# Patient Record
Sex: Male | Born: 1955 | Hispanic: Yes | Marital: Married | Smoking: Never smoker
Health system: Southern US, Community
[De-identification: ages and names within clinical notes are randomized; demographics above are authoritative.]

## PROBLEM LIST (undated history)

## (undated) DIAGNOSIS — J45909 Unspecified asthma, uncomplicated: Secondary | ICD-10-CM

---

## 2018-09-29 ENCOUNTER — Inpatient Hospital Stay (HOSPITAL_COMMUNITY)
Admission: EM | Admit: 2018-09-29 | Discharge: 2018-10-05 | DRG: 177 | Disposition: A | Discharge status: Home / Self Care | Payer: Medicare Other | Attending: Internal Medicine | Admitting: Internal Medicine

## 2018-09-29 ENCOUNTER — Encounter (HOSPITAL_COMMUNITY): Payer: Self-pay | Admitting: *Deleted

## 2018-09-29 ENCOUNTER — Emergency Department (HOSPITAL_COMMUNITY): Payer: Medicare Other

## 2018-09-29 ENCOUNTER — Other Ambulatory Visit: Payer: Self-pay

## 2018-09-29 DIAGNOSIS — U071 COVID-19: Secondary | ICD-10-CM | POA: Diagnosis not present

## 2018-09-29 DIAGNOSIS — R0602 Shortness of breath: Secondary | ICD-10-CM

## 2018-09-29 DIAGNOSIS — J069 Acute upper respiratory infection, unspecified: Secondary | ICD-10-CM

## 2018-09-29 DIAGNOSIS — R740 Nonspecific elevation of levels of transaminase and lactic acid dehydrogenase [LDH]: Secondary | ICD-10-CM | POA: Diagnosis present

## 2018-09-29 DIAGNOSIS — J1282 Pneumonia due to coronavirus disease 2019: Secondary | ICD-10-CM | POA: Diagnosis present

## 2018-09-29 DIAGNOSIS — J9601 Acute respiratory failure with hypoxia: Secondary | ICD-10-CM | POA: Diagnosis present

## 2018-09-29 DIAGNOSIS — J45909 Unspecified asthma, uncomplicated: Secondary | ICD-10-CM | POA: Diagnosis present

## 2018-09-29 DIAGNOSIS — R079 Chest pain, unspecified: Secondary | ICD-10-CM | POA: Diagnosis not present

## 2018-09-29 DIAGNOSIS — J1289 Other viral pneumonia: Secondary | ICD-10-CM | POA: Diagnosis present

## 2018-09-29 DIAGNOSIS — J129 Viral pneumonia, unspecified: Secondary | ICD-10-CM

## 2018-09-29 HISTORY — DX: Unspecified asthma, uncomplicated: J45.909

## 2018-09-29 LAB — CBC
HCT: 42.1 % (ref 39.0–52.0)
Hemoglobin: 13.9 g/dL (ref 13.0–17.0)
MCH: 31.1 pg (ref 26.0–34.0)
MCHC: 33 g/dL (ref 30.0–36.0)
MCV: 94.2 fL (ref 80.0–100.0)
Platelets: 211 10*3/uL (ref 150–400)
RBC: 4.47 MIL/uL (ref 4.22–5.81)
RDW: 13.8 % (ref 11.5–15.5)
WBC: 6.1 10*3/uL (ref 4.0–10.5)
nRBC: 0 % (ref 0.0–0.2)

## 2018-09-29 LAB — COMPREHENSIVE METABOLIC PANEL
ALT: 182 U/L — ABNORMAL HIGH (ref 0–44)
AST: 121 U/L — ABNORMAL HIGH (ref 15–41)
Albumin: 2.6 g/dL — ABNORMAL LOW (ref 3.5–5.0)
Alkaline Phosphatase: 105 U/L (ref 38–126)
Anion gap: 10 (ref 5–15)
BUN: 16 mg/dL (ref 8–23)
CO2: 23 mmol/L (ref 22–32)
Calcium: 8.6 mg/dL — ABNORMAL LOW (ref 8.9–10.3)
Chloride: 104 mmol/L (ref 98–111)
Creatinine, Ser: 0.84 mg/dL (ref 0.61–1.24)
GFR calc Af Amer: >60 mL/min (ref >60)
GFR calc non Af Amer: >60 mL/min (ref >60)
Glucose, Bld: 121 mg/dL — ABNORMAL HIGH (ref 70–99)
Potassium: 4 mmol/L (ref 3.5–5.1)
Sodium: 137 mmol/L (ref 135–145)
Total Bilirubin: 0.4 mg/dL (ref 0.3–1.2)
Total Protein: 6.2 g/dL — ABNORMAL LOW (ref 6.5–8.1)

## 2018-09-29 LAB — SARS CORONAVIRUS 2: SARS Coronavirus 2: DETECTED — AB

## 2018-09-29 LAB — LACTIC ACID, PLASMA: Lactic Acid, Venous: 1.5 mmol/L (ref 0.5–1.9)

## 2018-09-29 LAB — TROPONIN I: Troponin I: <0.03 ng/mL (ref <0.03)

## 2018-09-29 MED ORDER — SODIUM CHLORIDE 0.9% FLUSH: 3.0000 mL | Freq: Once | INTRAVENOUS | Status: DC

## 2018-09-29 MED ORDER — SODIUM CHLORIDE 0.9 % IV BOLUS
1000.0000 mL | Freq: Once | INTRAVENOUS | Status: AC
  Administered 2018-09-29: 1000 mL via INTRAVENOUS

## 2018-09-29 NOTE — ED Triage Notes (Signed)
Via interpreter, pt says he has been feeling bad since Tuesday (fevers and cough). Reports had covid test done at health department, no results yet. Says he was at the hospital last night and not feeling any better. C/o tightness in his chest and SOB.    Per patient daughter via phone the pt was in the ED last night in [place] and was dx with PNA, not given any meds for the same. She has been checking his oxygen and temperature during the day and his temp has been high, oxygen has been as low as 86%. Seems like his breathing is worse. Reports pt son received +covid test on Thursday.

## 2018-09-29 NOTE — ED Notes (Signed)
Call pt daughter with update please Richardson Chiquito [phone]

## 2018-09-29 NOTE — ED Provider Notes (Signed)
[facility] EMERGENCY DEPARTMENT Provider Note   CSN: [id number] Arrival date & time: 09/29/18  2022     History   Chief Complaint Chief Complaint  Patient presents with  . Chest Pain    HPI Charles Arias is a 63 y.o. male who presents with 4 days of fever, chills, body aches, worsening dyspnea.  He is associated dysgeusia and anosmia.  Patient son recently tested positive for corona virus.  He had an outpatient test which is still pending however was unable to wait longer because of his worsening shortness of breath.  Denies abdominal pain, nausea, vomiting, urinary symptoms, diarrhea.     HPI  Past Medical History:  Diagnosis Date  . Asthma     There are no active problems to display for this patient.   History reviewed. No pertinent surgical history.      Home Medications    Prior to Admission medications   Not on File    Family History No family history on file.  Social History Social History   Tobacco Use  . Smoking status: Never Smoker  Substance Use Topics  . Alcohol use: Yes  . Drug use: Never     Allergies   Patient has no known allergies.   Review of Systems Review of Systems Ten systems reviewed and are negative for acute change, except as noted in the HPI.    Physical Exam Updated Vital Signs BP 139/86 (BP Location: Right Arm)   Pulse (!) 106   Temp 99.9 F (37.7 C) (Oral)   Resp (!) 22   SpO2 93%   Physical Exam Vitals signs and nursing note reviewed.  Constitutional:      General: He is not in acute distress.    Appearance: He is well-developed. He is ill-appearing. He is not diaphoretic.  HENT:     Head: Normocephalic and atraumatic.  Eyes:     General: No scleral icterus.    Extraocular Movements: Extraocular movements intact.     Conjunctiva/sclera: Conjunctivae normal.     Pupils: Pupils are equal, round, and reactive to light.  Neck:     Musculoskeletal: Normal range of motion and neck supple.   Cardiovascular:     Rate and Rhythm: Normal rate and regular rhythm.     Heart sounds: Normal heart sounds.  Pulmonary:     Effort: Pulmonary effort is normal. Tachypnea present. No respiratory distress.     Breath sounds: Normal breath sounds. No wheezing.  Abdominal:     Palpations: Abdomen is soft.     Tenderness: There is no abdominal tenderness.  Skin:    General: Skin is warm and dry.  Neurological:     General: No focal deficit present.     Mental Status: He is alert and oriented to person, place, and time.  Psychiatric:        Behavior: Behavior normal.      ED Treatments / Results  Labs (all labs ordered are listed, but only abnormal results are displayed) Labs Reviewed  SARS CORONAVIRUS 2 (HOSPITAL ORDER, [place] [facility])  CULTURE, BLOOD (ROUTINE X 2)  CULTURE, BLOOD (ROUTINE X 2)  CBC  TROPONIN I  LACTIC ACID, PLASMA  LACTIC ACID, PLASMA  COMPREHENSIVE METABOLIC PANEL  URINALYSIS, ROUTINE W REFLEX MICROSCOPIC    EKG    Radiology Dg Chest Portable 1 View  Result Date: 09/29/2018 CLINICAL DATA:  Shortness of breath, chest pain EXAM: PORTABLE CHEST 1 VIEW COMPARISON:  None.  FINDINGS: Heart is upper limits normal in size. Suspect patchy bilateral airspace opacities. No effusions. No acute bony abnormality. IMPRESSION: Patchy bilateral airspace opacities concerning for pneumonia. Electronically Signed   By: Charlett NoseKevin  Dover M.D.   On: 09/29/2018 21:35    Procedures Procedures (including critical care time)  Medications Ordered in ED Medications  sodium chloride flush (NS) 0.9 % injection 3 mL (has no administration in time range)  sodium chloride 0.9 % bolus 1,000 mL (has no administration in time range)     Initial Impression / Assessment and Plan / ED Course  I have reviewed the triage vital signs and the nursing notes.  Pertinent labs & imaging results that were available during my care of the patient were reviewed by me and  considered in my medical decision making (see chart for details).  Clinical Course as of Sep 29 2335  [name] Sep 29, 2018  2232 AST(!): 121 [AH]  2233 ALT(!): 182 [AH]  2333 SARS Coronavirus 2(!): DETECTED [AH]    Clinical Course User Index [AH] Arthor CaptainHarris, Neha Waight, PA-C       11:38 PM BP 139/86 (BP Location: Right Arm)   Pulse (!) 106   Temp 99.9 F (37.7 C) (Oral)   Resp (!) 22   Ht 5' 2.99" (1.6 m)   Wt 70.3 kg   SpO2 93%   BMI 27.46 kg/m    CC:.  Viral-like symptoms with cough, body ache, chills and shortness of breath, exposure to known coronavirus VS: BP 139/86 (BP Location: Right Arm)   Pulse (!) 106   Temp 99.9 F (37.7 C) (Oral)   Resp (!) 22   Ht 5' 2.99" (1.6 m)   Wt 70.3 kg   SpO2 93%   BMI 27.46 kg/m   Persistent tachypnea, dyspnea without overt respiratory distress ZO:XWRUEAVHX:History is gathered by patient and EMR. DDX:The emergent differential diagnosis for shortness of breath includes, but is not limited to, Pulmonary edema, bronchoconstriction, Pneumonia, Pulmonary embolism, Pneumotherax/ Hemothorax, Dysrythmia, ACS.  Labs: I reviewed the labs which show slightly elevated glucose, mildly low albumin, elevated AST and ALT.  No previous labs to compare to.  Urine is pending, negative troponin, negative lactic acid, no leukocytosis Imaging: I personally reviewed the images (portable 1 view chest x-ray) which show(s) multifocal/multi lobar groundglass opacity and patchy infiltrates. EKG:  EKG Interpretation  Date/Time:  Sunday September 29 2018 20:35:39 EDT Ventricular Rate:  127 PR Interval:  140 QRS Duration: 72 QT Interval:  300 QTC Calculation: 436 R Axis:   18 Text Interpretation:  Sinus tachycardia Otherwise normal ECG agree. no old comparison Confirmed by Arby BarrettePfeiffer, Marcy [phone]([id number]) on 09/29/2018 11:41:18 PM       MDM: With coronavirus positive, multifocal pneumonia, oxygen saturations are low.  He has worsening dyspnea for the past several days which brought him  to the emergency department.  Feel the patient will need admission.  His persistent tachypnea throughout his visit Patient disposition: Admit Patient condition: Stable. The patient appears reasonably stabilized for admission considering the current resources, flow, and capabilities available in the ED at this time, and I doubt any other [facility] requiring further screening and/or treatment in the ED prior to admission.   Charles Arias Uribe was evaluated in Emergency Department on 09/29/2018 for the symptoms described in the history of present illness. He was evaluated in the context of the global COVID-19 pandemic, which necessitated consideration that the patient might be at risk for infection with the SARS-CoV-2 virus that causes COVID-19. Institutional protocols  and algorithms that pertain to the evaluation of patients at risk for COVID-19 are in a state of rapid change based on information released by regulatory bodies including the CDC and federal and state organizations. These policies and algorithms were followed during the patient's care in the ED.  Final Clinical Impressions(s) / ED Diagnoses   Final diagnoses:  COVID-19 virus infection  Viral pneumonia, unspecified  Shortness of breath    ED Discharge Orders    None       Arthor CaptainHarris, Dreanna Kyllo, PA-C 09/29/18 2352    Arby BarrettePfeiffer, Marcy, MD 09/30/18 2040

## 2018-09-30 ENCOUNTER — Encounter (HOSPITAL_COMMUNITY): Payer: Self-pay

## 2018-09-30 DIAGNOSIS — J069 Acute upper respiratory infection, unspecified: Secondary | ICD-10-CM

## 2018-09-30 DIAGNOSIS — R0602 Shortness of breath: Secondary | ICD-10-CM | POA: Diagnosis not present

## 2018-09-30 DIAGNOSIS — J9601 Acute respiratory failure with hypoxia: Secondary | ICD-10-CM | POA: Diagnosis present

## 2018-09-30 DIAGNOSIS — R079 Chest pain, unspecified: Secondary | ICD-10-CM | POA: Diagnosis present

## 2018-09-30 DIAGNOSIS — J1289 Other viral pneumonia: Secondary | ICD-10-CM | POA: Diagnosis present

## 2018-09-30 DIAGNOSIS — J1282 Pneumonia due to coronavirus disease 2019: Secondary | ICD-10-CM | POA: Diagnosis present

## 2018-09-30 DIAGNOSIS — J129 Viral pneumonia, unspecified: Secondary | ICD-10-CM | POA: Diagnosis not present

## 2018-09-30 DIAGNOSIS — U071 COVID-19: Secondary | ICD-10-CM | POA: Diagnosis present

## 2018-09-30 DIAGNOSIS — R740 Nonspecific elevation of levels of transaminase and lactic acid dehydrogenase [LDH]: Secondary | ICD-10-CM | POA: Diagnosis present

## 2018-09-30 DIAGNOSIS — J45909 Unspecified asthma, uncomplicated: Secondary | ICD-10-CM | POA: Diagnosis present

## 2018-09-30 LAB — URINALYSIS, ROUTINE W REFLEX MICROSCOPIC
Bilirubin Urine: NEGATIVE
Glucose, UA: NEGATIVE mg/dL
Hgb urine dipstick: NEGATIVE
Ketones, ur: NEGATIVE mg/dL
Leukocytes,Ua: NEGATIVE
Nitrite: NEGATIVE
Protein, ur: 30 mg/dL — AB
Specific Gravity, Urine: 1.016 (ref 1.005–1.030)
pH: 6 (ref 5.0–8.0)

## 2018-09-30 LAB — FERRITIN: Ferritin: 1062 ng/mL — ABNORMAL HIGH (ref 24–336)

## 2018-09-30 LAB — LACTATE DEHYDROGENASE: LDH: 525 U/L — ABNORMAL HIGH (ref 98–192)

## 2018-09-30 LAB — PROCALCITONIN: Procalcitonin: 0.36 ng/mL

## 2018-09-30 LAB — LACTIC ACID, PLASMA: Lactic Acid, Venous: 1.4 mmol/L (ref 0.5–1.9)

## 2018-09-30 LAB — HIV ANTIBODY (ROUTINE TESTING W REFLEX): HIV Screen 4th Generation wRfx: NONREACTIVE

## 2018-09-30 LAB — C-REACTIVE PROTEIN: CRP: 16.1 mg/dL — ABNORMAL HIGH (ref <1.0)

## 2018-09-30 LAB — D-DIMER, QUANTITATIVE: D-Dimer, Quant: 1.56 ug/mL-FEU — ABNORMAL HIGH (ref 0.00–0.50)

## 2018-09-30 LAB — ABO/RH: ABO/RH(D): A POS

## 2018-09-30 MED ORDER — ALBUTEROL SULFATE HFA 108 (90 BASE) MCG/ACT IN AERS
2.0000 | INHALATION_SPRAY | RESPIRATORY_TRACT | Status: DC | PRN
  Filled 2018-09-30: qty 6.7

## 2018-09-30 MED ORDER — TOCILIZUMAB 400 MG/20ML IV SOLN
8.0000 mg/kg | Freq: Once | INTRAVENOUS | Status: AC
  Administered 2018-09-30: 12:00:00 562 mg via INTRAVENOUS
  Filled 2018-09-30: qty 20

## 2018-09-30 MED ORDER — SODIUM CHLORIDE 0.9 % IV SOLN
200.0000 mg | Freq: Once | INTRAVENOUS | Status: AC
  Administered 2018-09-30: 200 mg via INTRAVENOUS
  Filled 2018-09-30: qty 40

## 2018-09-30 MED ORDER — ONDANSETRON HCL 4 MG PO TABS: 4.0000 mg | ORAL_TABLET | Freq: Four times a day (QID) | ORAL | Status: DC | PRN

## 2018-09-30 MED ORDER — ACETAMINOPHEN 325 MG PO TABS
650.0000 mg | ORAL_TABLET | Freq: Four times a day (QID) | ORAL | Status: DC | PRN
  Administered 2018-10-02: 650 mg via ORAL
  Filled 2018-09-30: qty 2

## 2018-09-30 MED ORDER — SODIUM CHLORIDE 0.9 % IV SOLN: 100.0000 mg | INTRAVENOUS | Status: DC

## 2018-09-30 MED ORDER — METHYLPREDNISOLONE SODIUM SUCC 40 MG IJ SOLR
40.0000 mg | Freq: Two times a day (BID) | INTRAMUSCULAR | Status: DC
  Administered 2018-09-30 – 2018-10-04 (×10): 40 mg via INTRAVENOUS
  Filled 2018-09-30 (×10): qty 1

## 2018-09-30 MED ORDER — ONDANSETRON HCL 4 MG/2ML IJ SOLN: 4.0000 mg | Freq: Four times a day (QID) | INTRAMUSCULAR | Status: DC | PRN

## 2018-09-30 MED ORDER — METHYLPREDNISOLONE SODIUM SUCC 125 MG IJ SOLR
80.0000 mg | Freq: Once | INTRAMUSCULAR | Status: AC
  Administered 2018-09-30: 80 mg via INTRAVENOUS
  Filled 2018-09-30: qty 2

## 2018-09-30 MED ORDER — SODIUM CHLORIDE 0.9 % IV SOLN
100.0000 mg | INTRAVENOUS | Status: AC
  Administered 2018-09-30 – 2018-10-03 (×4): 100 mg via INTRAVENOUS
  Filled 2018-09-30 (×4): qty 20

## 2018-09-30 MED ORDER — ENOXAPARIN SODIUM 40 MG/0.4ML ~~LOC~~ SOLN
40.0000 mg | SUBCUTANEOUS | Status: DC
  Administered 2018-09-30 – 2018-10-04 (×5): 40 mg via SUBCUTANEOUS
  Filled 2018-09-30 (×6): qty 0.4

## 2018-09-30 NOTE — Progress Notes (Signed)
Patient is admitted and oriented to room 157. Utilized Engineer, petroleum. Admission assessment complete. Questions encouraged and answered.  [place], pt's daughter, to notify her of his admission and to discuss the plan of care. Questions were encouraged and answered.  Paged Dr. Sarajane Jews to notify of patient's arrival.

## 2018-09-30 NOTE — Progress Notes (Signed)
PROGRESS NOTE  Charles Arias ZOX:[id number] DOB: [birth year] DOA: 09/29/2018 PCP: Patient, No Pcp Per   LOS: 0 days   Brief Narrative / Interim history: 63 year old male with history of asthma but otherwise fairly healthy on no medications at home comes to the hospital with 4 days history of fever chills, body aches, worsening shortness of breath as well as loss of smell and taste  Subjective: Complains of shortness of breath but feels slightly improved.  No chest pain, no abdominal pain, no nausea or vomiting.  His breathing gets worse especially if he tries to take a few steps.  No fevers this morning  Assessment & Plan: Principal Problem:   Acute respiratory disease due to COVID-19 virus Active Problems:   Acute respiratory failure with hypoxia (HCC)   Principal Problem Acute Hypoxic Respiratory Failure due to Covid-19 Viral Illness -Patient admitted to hospital with hypoxic respiratory failure, fever, and COVID-19 viral pneumonia -Patient was started on Remdesivir on 6/15 -Start steroids on 6/15 -Given significantly elevated inflammatory markers along with tachypnea and chest x-ray findings, discussed with patient, agrees to Actemra  The treatment plan and use of medications and known side effects were discussed with patient/family, they were clearly explained that there is no proven definitive treatment for COVID-19 infection, any medications used here are based on published clinical articles/anecdotal data which are not peer-reviewed or randomized control trials.  Complete risks and long-term side effects are unknown, however in the best clinical judgment they seem to be of some clinical benefit rather than medical risks.  Patient agree with the treatment plan and want to receive the given medications.  COVID-19 Labs  Recent Labs    09/30/18 0207  DDIMER 1.56*  FERRITIN 1,062*  LDH 525*  CRP 16.1*    Lab Results  Component Value Date   SARSCOV2NAA DETECTED (A) 09/29/2018    Active Problems History of asthma -Scattered end expiratory wheezing present, continue albuterol inhaler as needed as well as steroids  Elevated LFTs -Likely in the setting of viral illness, less than 5 times upper limit of normal -monitor  Scheduled Meds: . enoxaparin (LOVENOX) injection  40 mg Subcutaneous Q24H  . methylPREDNISolone (SOLU-MEDROL) injection  40 mg Intravenous Q12H  . sodium chloride flush  3 mL Intravenous Once   Continuous Infusions: . [START ON 10/01/2018] remdesivir 100 mg in NS 250 mL    . tocilizumab (ACTEMRA) IV     PRN Meds:.acetaminophen, albuterol, ondansetron **OR** ondansetron (ZOFRAN) IV  DVT prophylaxis: Lovenox Code Status: Full code Family Communication: No family at bedside, discussed with patient via interpreter Disposition Plan: Home when ready  Consultants:   None  Procedures:   None   Antimicrobials:  Remdesivir   Objective: Vitals:   09/29/18 2247 09/30/18 0552 09/30/18 0600 09/30/18 0948  BP:  128/82  126/82  Pulse:   85 91  Resp:   (!) 23 (!) 24  Temp:  97.9 F (36.6 C)  97.9 F (36.6 C)  TempSrc:  Oral  Axillary  SpO2:   94% 94%  Weight: 70.3 kg     Height: 5' 2.99" (1.6 m)       Intake/Output Summary (Last 24 hours) at 09/30/2018 1031 Last data filed at 09/30/2018 0900 Gross per 24 hour  Intake 1240 ml  Output 975 ml  Net 265 ml   Filed Weights   09/29/18 2247  Weight: 70.3 kg    Examination:  Constitutional: Appears comfortable but visibly tachypneic Eyes: PERRL, lids and conjunctivae normal ENMT: Mucous  membranes are moist. No oropharyngeal exudates Neck: normal, supple Respiratory: Faint bibasilar rhonchi, diminished end expiratory wheezing, no crackles, increased respiratory effort Cardiovascular: Regular rate and rhythm, no murmurs / rubs / gallops. No LE edema.  Abdomen: no tenderness. Bowel sounds positive.  Musculoskeletal: no clubbing / cyanosis.  Skin: no rashes Neurologic: CN 2-12 grossly  intact. Strength 5/5 in all 4.  Psychiatric: Normal judgment and insight. Alert and oriented x 3. Normal mood.    Data Reviewed: I have independently reviewed following labs and imaging studies   bilateral infiltrates    CBC: Recent Labs  Lab 09/29/18 2116  WBC 6.1  HGB 13.9  HCT 42.1  MCV 94.2  PLT 914   Basic Metabolic Panel: Recent Labs  Lab 09/29/18 2116  NA 137  K 4.0  CL 104  CO2 23  GLUCOSE 121*  BUN 16  CREATININE 0.84  CALCIUM 8.6*   GFR: Estimated Creatinine Clearance: 77.3 mL/min (by C-G formula based on SCr of 0.84 mg/dL). Liver Function Tests: Recent Labs  Lab 09/29/18 2116  AST 121*  ALT 182*  ALKPHOS 105  BILITOT 0.4  PROT 6.2*  ALBUMIN 2.6*   No results for input(s): LIPASE, AMYLASE in the last 168 hours. No results for input(s): AMMONIA in the last 168 hours. Coagulation Profile: No results for input(s): INR, PROTIME in the last 168 hours. Cardiac Enzymes: Recent Labs  Lab 09/29/18 2116  TROPONINI <0.03   BNP (last 3 results) No results for input(s): PROBNP in the last 8760 hours. HbA1C: No results for input(s): HGBA1C in the last 72 hours. CBG: No results for input(s): GLUCAP in the last 168 hours. Lipid Profile: No results for input(s): CHOL, HDL, LDLCALC, TRIG, CHOLHDL, LDLDIRECT in the last 72 hours. Thyroid Function Tests: No results for input(s): TSH, T4TOTAL, FREET4, T3FREE, THYROIDAB in the last 72 hours. Anemia Panel: Recent Labs    09/30/18 0207  FERRITIN 1,062*   Urine analysis:    Component Value Date/Time   COLORURINE YELLOW 09/30/2018 0239   APPEARANCEUR CLEAR 09/30/2018 0239   LABSPEC 1.016 09/30/2018 0239   PHURINE 6.0 09/30/2018 0239   GLUCOSEU NEGATIVE 09/30/2018 0239   HGBUR NEGATIVE 09/30/2018 0239   BILIRUBINUR NEGATIVE 09/30/2018 0239   KETONESUR NEGATIVE 09/30/2018 0239   PROTEINUR 30 (A) 09/30/2018 0239   NITRITE NEGATIVE 09/30/2018 0239   LEUKOCYTESUR NEGATIVE 09/30/2018 0239   Sepsis  Labs: Invalid input(s): PROCALCITONIN, LACTICIDVEN  Recent Results (from the past 240 hour(s))  SARS Coronavirus 2     Status: Abnormal   Collection Time: 09/29/18  9:40 PM  Result Value Ref Range Status   SARS Coronavirus 2 DETECTED (A) NOT DETECTED Final    Comment: RESULT CALLED TO, READ BACK BY AND VERIFIED WITH: RN J LEBRON @2325  09/29/18 BY S GEZAHEGN (NOTE) SARS-CoV-2 target nucleic acids are DETECTED. The SARS-CoV-2 RNA is generally detectable in upper and lower respiratory specimens during the acute phase of infection. Positive results are indicative of active infection with SARS-CoV-2. Clinical  correlation with patient history and other diagnostic information is necessary to determine patient infection status. Positive results do  not rule out bacterial infection or co-infection with other viruses. The expected result is Not Detected. Fact Sheet for Patients: http://www.biofiredefense.com/wp-content/uploads/2020/03/BIOFIRE-COVID -19-patients.pdf Fact Sheet for Healthcare Providers: http://www.biofiredefense.com/wp-content/uploads/2020/03/BIOFIRE-COVID -19-hcp.pdf This test is not yet approved or cleared by the [place] and  has been authorized for detection and/or diagnosis of SARS-CoV-2 by FDA under an Patent examiner y Insurance account manager (EUA).  This EUA will  remain in effect (meaning this test can be used) for the duration of  the COVID-19 declaration under Section 564(b)(1) of the Act, 21 U.S.C. section [id number] 3(b)(1), unless the authorization is terminated or revoked sooner. Performed at [facility], [address] [place], [place] [address]       Radiology Studies: Dg Chest Portable 1 View  Result Date: 09/29/2018 CLINICAL DATA:  Shortness of breath, chest pain EXAM: PORTABLE CHEST 1 VIEW COMPARISON:  None. FINDINGS: Heart is upper limits normal in size. Suspect patchy bilateral airspace opacities. No effusions. No acute bony abnormality. IMPRESSION:  Patchy bilateral airspace opacities concerning for pneumonia. Electronically Signed   By: Charlett NoseKevin  Dover M.D.   On: 09/29/2018 21:35     Pamella Pertostin Gherghe, MD, PhD [facility]  Contact via  [email or web address]  [facility] Office Info P: [phone]  F: [phone]

## 2018-09-30 NOTE — ED Notes (Signed)
Urine culture sent to lab with urine 

## 2018-09-30 NOTE — Progress Notes (Signed)
[facility] physician asked me to give single dose of solumedrol 80mg  and put in for remdesivir per pharm.

## 2018-09-30 NOTE — ED Notes (Signed)
ED TO INPATIENT HANDOFF REPORT  ED Nurse Name and Phone #: [phone]  S Name/Age/Gender Francie MassingJavier Uribe 63 y.o. male Room/Bed: 027C/027C  Code Status Full code   Home/SNF/Other HOme AO x 4 spanish speaker   Triage Complete: Triage complete  Chief Complaint PNEUMONIA, FEVER, SOB  Triage Note Via interpreter, pt says he has been feeling bad since Tuesday (fevers and cough). Reports had covid test done at health department, no results yet. Says he was at the hospital last night and not feeling any better. C/o tightness in his chest and SOB.    Per patient daughter via phone the pt was in the ED last night in [place] and was dx with PNA, not given any meds for the same. She has been checking his oxygen and temperature during the day and his temp has been high, oxygen has been as low as 86%. Seems like his breathing is worse. Reports pt son received +covid test on Thursday.    Allergies No Known Allergies  Level of Care/Admitting Diagnosis ED Disposition    ED Disposition Condition Comment   Admit  The patient appears reasonably stabilized for admission considering the current resources, flow, and capabilities available in the ED at this time, and I doubt any other [facility] requiring further screening and/or treatment in the ED prior to admission is  present.       B Medical/Surgery History Past Medical History:  Diagnosis Date  . Asthma    History reviewed. No pertinent surgical history.   A IV Location/Drains/Wounds Patient Lines/Drains/Airways Status   Active Line/Drains/Airways    Name:   Placement date:   Placement time:   Site:   Days:   Peripheral IV 09/29/18 Left Antecubital   09/29/18    2149    Antecubital   1          Intake/Output Last 24 hours No intake or output data in the 24 hours ending 09/30/18 0027  Labs/Imaging Results for orders placed or performed during the hospital encounter of 09/29/18 (from the past 48 hour(s))  CBC     Status: None   Collection Time: 09/29/18  9:16 PM  Result Value Ref Range   WBC 6.1 4.0 - 10.5 K/uL   RBC 4.47 4.22 - 5.81 MIL/uL   Hemoglobin 13.9 13.0 - 17.0 g/dL   HCT 45.442.1 09.839.0 - 11.952.0 %   MCV 94.2 80.0 - 100.0 fL   MCH 31.1 26.0 - 34.0 pg   MCHC 33.0 30.0 - 36.0 g/dL   RDW 14.713.8 82.911.5 - 56.215.5 %   Platelets 211 150 - 400 K/uL   nRBC 0.0 0.0 - 0.2 %    Comment: Performed at [facility], [address] [place], [place] [address]  Troponin I - ONCE - STAT     Status: None   Collection Time: 09/29/18  9:16 PM  Result Value Ref Range   Troponin I <0.03 <0.03 ng/mL    Comment: Performed at [facility], [address] [place], [place] [address]  Lactic acid, plasma     Status: None   Collection Time: 09/29/18  9:16 PM  Result Value Ref Range   Lactic Acid, Venous 1.5 0.5 - 1.9 mmol/L    Comment: Performed at [facility], [address] [place], [place] [address]  Comprehensive metabolic panel     Status: Abnormal   Collection Time: 09/29/18  9:16 PM  Result Value Ref Range   Sodium 137 135 -  145 mmol/L   Potassium 4.0 3.5 - 5.1 mmol/L   Chloride 104 98 - 111 mmol/L   CO2 23 22 - 32 mmol/L   Glucose, Bld 121 (H) 70 - 99 mg/dL   BUN 16 8 - 23 mg/dL   Creatinine, Ser 4.780.84 0.61 - 1.24 mg/dL   Calcium 8.6 (L) 8.9 - 10.3 mg/dL   Total Protein 6.2 (L) 6.5 - 8.1 g/dL   Albumin 2.6 (L) 3.5 - 5.0 g/dL   AST 295121 (H) 15 - 41 U/L   ALT 182 (H) 0 - 44 U/L   Alkaline Phosphatase 105 38 - 126 U/L   Total Bilirubin 0.4 0.3 - 1.2 mg/dL   GFR calc non Af Amer >60 >60 mL/min   GFR calc Af Amer >60 >60 mL/min   Anion gap 10 5 - 15    Comment: Performed at [facility], [address] [place], [place] [address]  SARS Coronavirus 2     Status: Abnormal   Collection Time: 09/29/18  9:40 PM  Result Value Ref Range   SARS Coronavirus 2 DETECTED (A) NOT DETECTED    Comment: RESULT CALLED TO, READ BACK BY AND VERIFIED WITH: RN J Mechele Kittleson @2325  09/29/18 BY S GEZAHEGN (NOTE) SARS-CoV-2  target nucleic acids are DETECTED. The SARS-CoV-2 RNA is generally detectable in upper and lower respiratory specimens during the acute phase of infection. Positive results are indicative of active infection with SARS-CoV-2. Clinical  correlation with patient history and other diagnostic information is necessary to determine patient infection status. Positive results do  not rule out bacterial infection or co-infection with other viruses. The expected result is Not Detected. Fact Sheet for Patients: http://www.biofiredefense.com/wp-content/uploads/2020/03/BIOFIRE-COVID -19-patients.pdf Fact Sheet for Healthcare Providers: http://www.biofiredefense.com/wp-content/uploads/2020/03/BIOFIRE-COVID -19-hcp.pdf This test is not yet approved or cleared by the [place] FDA and  has been authorized for detection and/or diagnosis of SARS-CoV-2 by FDA under an Lexicographermergenc y Investment banker, corporateUse Authorization (EUA).  This EUA will remain in effect (meaning this test can be used) for the duration of  the COVID-19 declaration under Section 564(b)(1) of the Act, 21 U.S.C. section [id number] 3(b)(1), unless the authorization is terminated or revoked sooner. Performed at [facility], [address] [place], [place] [address]    Dg Chest Portable 1 View  Result Date: 09/29/2018 CLINICAL DATA:  Shortness of breath, chest pain EXAM: PORTABLE CHEST 1 VIEW COMPARISON:  None. FINDINGS: Heart is upper limits normal in size. Suspect patchy bilateral airspace opacities. No effusions. No acute bony abnormality. IMPRESSION: Patchy bilateral airspace opacities concerning for pneumonia. Electronically Signed   By: Charlett NoseKevin  Dover M.D.   On: 09/29/2018 21:35    Pending Labs Unresulted Labs (From admission, onward)    Start     Ordered   09/29/18 2133  Blood Culture (routine x 2)  BLOOD CULTURE X 2,   STAT     09/29/18 2134   09/29/18 2133  Urinalysis, Routine w reflex microscopic  ONCE - STAT,   STAT     09/29/18 2134    09/29/18 2103  Lactic acid, plasma  Now then every 2 hours,   STAT     09/29/18 2103          Vitals/Pain Today's Vitals   09/29/18 2134 09/29/18 2200 09/29/18 2215 09/29/18 2247  BP: 139/86 125/85 (!) 143/83   Pulse: (!) 106 100 (!) 106   Resp: (!) 22 (!) 25 (!) 25   Temp: 99.9 F (37.7 C)  TempSrc: Oral     SpO2: 93% 92% 93%   Weight:    70.3 kg  Height:    5' 2.99" (1.6 m)  PainSc:        Isolation Precautions Droplet and Contact precautions  Medications Medications  sodium chloride flush (NS) 0.9 % injection 3 mL (3 mLs Intravenous Not Given 09/29/18 2147)  sodium chloride 0.9 % bolus 1,000 mL (1,000 mLs Intravenous New Bag/Given 09/29/18 2246)    Mobility Self care Low fall risk   Focused Assessments Pt very SOB, having chest discomfort for the past 4 days, with fever and change on taste.   R Recommendations: See Admitting Provider Note  Report given to:   Additional Notes:

## 2018-09-30 NOTE — H&P (Signed)
History and Physical    Charles Arias EXB:[id number] DOB: 08/14/1955 DOA: 09/29/2018  PCP: Patient, No Pcp Per  Patient coming from: Home  I have personally briefly [facility]  Chief Complaint: SOB, ? COVID  HPI: Charles Arias is a 63 y.o. male with medical history significant of asthma, but previously fairly healthy.  Patient's son was just diagnosed with positive COVID test.  Patient presents to ED with 4 day h/o fever, chills, body aches, worsening shortness of breath.  Also has dysgeusia and anosmia.    Had outpatient test for COVID himself, results still pending, but unable to wait any longer because his SOB has become so bad.   ED Course: COVID confirmed on testing.  CXR c/w multifocal PNA (COVID).  Tm 99.9.  HR 127, now 106 after 1L bolus.  BP 143/83.  WBC 6.1.  Satting ~90% on RA, if he moves around at all then drops into the 80s.   Review of Systems: As per HPI otherwise 10 point review of systems negative.   Past Medical History:  Diagnosis Date  . Asthma     History reviewed. No pertinent surgical history.   reports that he has never smoked. He does not have any smokeless tobacco history on file. He reports current alcohol use. He reports that he does not use drugs.  No Known Allergies  No family history on file.   Prior to Admission medications   Medication Sig Start Date End Date Taking? Authorizing Provider  acetaminophen (TYLENOL) 500 MG tablet Take 1,000 mg by mouth every 6 (six) hours as needed for fever or headache (pain).   Yes [provider]  albuterol (VENTOLIN HFA) 108 (90 Base) MCG/ACT inhaler Inhale 2 puffs into the lungs every 6 (six) hours as needed for wheezing or shortness of breath.  05/08/18  Yes [provider]    Physical Exam: Vitals:   09/29/18 2134 09/29/18 2200 09/29/18 2215 09/29/18 2247  BP: 139/86 125/85 (!) 143/83   Pulse: (!) 106 100 (!) 106   Resp: (!) 22 (!) 25 (!)  25   Temp: 99.9 F (37.7 C)     TempSrc: Oral     SpO2: 93% 92% 93%   Weight:    70.3 kg  Height:    5' 2.99" (1.6 m)    Constitutional: NAD, calm, ill appearing Eyes: PERRL, lids and conjunctivae normal ENMT: Mucous membranes are moist. Posterior pharynx clear of any exudate or lesions.Normal dentition.  Neck: normal, supple, no masses, no thyromegaly Respiratory: Tachypnea Cardiovascular: Regular rate and rhythm, no murmurs / rubs / gallops. No extremity edema. 2+ pedal pulses. No carotid bruits.  Abdomen: no tenderness, no masses palpated. No hepatosplenomegaly. Bowel sounds positive.  Musculoskeletal: no clubbing / cyanosis. No joint deformity upper and lower extremities. Good ROM, no contractures. Normal muscle tone.  Skin: no rashes, lesions, ulcers. No induration Neurologic: CN 2-12 grossly intact. Sensation intact, DTR normal. Strength 5/5 in all 4.  Psychiatric: Normal judgment and insight. Alert and oriented x 3. Normal mood.    Labs on Admission: I have personally reviewed following labs and imaging studies  CBC: Recent Labs  Lab 09/29/18 2116  WBC 6.1  HGB 13.9  HCT 42.1  MCV 94.2  PLT 211   Basic Metabolic Panel: Recent Labs  Lab 09/29/18 2116  NA 137  K 4.0  CL 104  CO2 23  GLUCOSE 121*  BUN 16  CREATININE 0.84  CALCIUM 8.6*  GFR: Estimated Creatinine Clearance: 77.3 mL/min (by C-G formula based on SCr of 0.84 mg/dL). Liver Function Tests: Recent Labs  Lab 09/29/18 2116  AST 121*  ALT 182*  ALKPHOS 105  BILITOT 0.4  PROT 6.2*  ALBUMIN 2.6*   No results for input(s): LIPASE, AMYLASE in the last 168 hours. No results for input(s): AMMONIA in the last 168 hours. Coagulation Profile: No results for input(s): INR, PROTIME in the last 168 hours. Cardiac Enzymes: Recent Labs  Lab 09/29/18 2116  TROPONINI <0.03   BNP (last 3 results) No results for input(s): PROBNP in the last 8760 hours. HbA1C: No results for input(s): HGBA1C in the  last 72 hours. CBG: No results for input(s): GLUCAP in the last 168 hours. Lipid Profile: No results for input(s): CHOL, HDL, LDLCALC, TRIG, CHOLHDL, LDLDIRECT in the last 72 hours. Thyroid Function Tests: No results for input(s): TSH, T4TOTAL, FREET4, T3FREE, THYROIDAB in the last 72 hours. Anemia Panel: No results for input(s): VITAMINB12, FOLATE, FERRITIN, TIBC, IRON, RETICCTPCT in the last 72 hours. Urine analysis: No results found for: COLORURINE, APPEARANCEUR, LABSPEC, PHURINE, GLUCOSEU, HGBUR, BILIRUBINUR, KETONESUR, PROTEINUR, UROBILINOGEN, NITRITE, LEUKOCYTESUR  Radiological Exams on Admission: Dg Chest Portable 1 View  Result Date: 09/29/2018 CLINICAL DATA:  Shortness of breath, chest pain EXAM: PORTABLE CHEST 1 VIEW COMPARISON:  None. FINDINGS: Heart is upper limits normal in size. Suspect patchy bilateral airspace opacities. No effusions. No acute bony abnormality. IMPRESSION: Patchy bilateral airspace opacities concerning for pneumonia. Electronically Signed   By: Charlett NoseKevin  Dover M.D.   On: 09/29/2018 21:35    EKG: Independently reviewed.  Assessment/Plan Principal Problem:   Acute respiratory disease due to COVID-19 virus Active Problems:   Acute respiratory failure with hypoxia (HCC)    1. COVID-19 PNA - causing hypoxia 1. Cont pulse ox 2. O2 via [place] 3. COVID pathway 4. D.dimer, CRP, LDH, ferritin, procalcitonin ordered 5. Daily CBC,CMP,D.Dimer,CRP 6. Monitor transaminitis - believed to be due to COVID as well 7. [facility] team informed of transfer, they will make decision after patient arrives regarding remdesivir and steroids.  DVT prophylaxis: Lovenox Code Status: Full Family Communication: Spoke with daughter on phone Disposition Plan: Home after admit Consults called: None Admission status: Admit to inpatient  Severity of Illness: The appropriate patient status for this patient is INPATIENT. Inpatient status is judged to be reasonable and necessary in order to  provide the required intensity of service to ensure the patient's safety. The patient's presenting symptoms, physical exam findings, and initial radiographic and laboratory data in the context of their chronic comorbidities is felt to place them at high risk for further clinical deterioration. Furthermore, it is not anticipated that the patient will be medically stable for discharge from the hospital within 2 midnights of admission. The following factors support the patient status of inpatient.   IP status for COVID PNA with new O2 requirement.   * I certify that at the point of admission it is my clinical judgment that the patient will require inpatient hospital care spanning beyond 2 midnights from the point of admission due to high intensity of service, high risk for further deterioration and high frequency of surveillance required.*    Johnnathan Hagemeister M. DO [facility]  How to contact the [facility] Attending or Consulting provider 7A - 7P or covering provider during after hours 7P -7A, for this patient?  1. Check the care team in [facility] and look for a) attending/consulting [facility] provider listed and b) the [facility] team listed 2.  Log into [email or web address]  Amion Physician Scheduling and messaging for groups and whole hospitals  On call and physician scheduling software for group practices, residents, hospitalists and other medical providers for call, clinic, rotation and shift schedules. On[employer] is a hospital-wide system for scheduling doctors and paging doctors on call. EasyPlot is for scientific plotting and data analysis.  [email or web address]  and use [employer]'s universal password to access. If you do not have the password, please contact the hospital operator.  3. Locate the [facility] provider you are looking for under [facility] and page to a number that you can be directly reached. 4. If you still have difficulty reaching the provider, please page the [facility] (Director on Call) for the  Hospitalists listed on amion for assistance.  09/30/2018, 1:28 AM

## 2018-09-30 NOTE — Progress Notes (Signed)
Utilized [employer] interpreter for shift assessment and inhaler with spacer education as well as discussion of plan of care. Questions encouraged and answered.  Updated daughter, Barry Brunner via phone while in the patient's room. Questions encouraged and answered.

## 2018-09-30 NOTE — ED Provider Notes (Signed)
Care assumed from previous provider PA Harris. Please see note for further details. Case discussed, plan agreed upon.  Patient is a 63 year old male who presented to the emergency department today for persistent chest tightness with worsening dyspnea, now at rest.  Did test positive for COVID.  Has been persistently tachycardic and tachypneic with oxygenation 92 to 93% on room air while at rest. Awaiting call from hospitalist to evaluate for admission.   1:01 AM Spoke with hospitalist who will admit.    Ward, Ozella Almond, PA-C 09/30/18 0102    Palumbo, April, MD 09/30/18 [phone]

## 2018-10-01 DIAGNOSIS — J069 Acute upper respiratory infection, unspecified: Secondary | ICD-10-CM

## 2018-10-01 DIAGNOSIS — J9601 Acute respiratory failure with hypoxia: Secondary | ICD-10-CM

## 2018-10-01 DIAGNOSIS — U071 COVID-19: Principal | ICD-10-CM

## 2018-10-01 DIAGNOSIS — J129 Viral pneumonia, unspecified: Secondary | ICD-10-CM

## 2018-10-01 DIAGNOSIS — R0602 Shortness of breath: Secondary | ICD-10-CM

## 2018-10-01 LAB — LACTATE DEHYDROGENASE: LDH: 446 U/L — ABNORMAL HIGH (ref 98–192)

## 2018-10-01 LAB — COMPREHENSIVE METABOLIC PANEL
ALT: 152 U/L — ABNORMAL HIGH (ref 0–44)
AST: 78 U/L — ABNORMAL HIGH (ref 15–41)
Albumin: 2.7 g/dL — ABNORMAL LOW (ref 3.5–5.0)
Alkaline Phosphatase: 113 U/L (ref 38–126)
Anion gap: 11 (ref 5–15)
BUN: 24 mg/dL — ABNORMAL HIGH (ref 8–23)
CO2: 25 mmol/L (ref 22–32)
Calcium: 8.6 mg/dL — ABNORMAL LOW (ref 8.9–10.3)
Chloride: 103 mmol/L (ref 98–111)
Creatinine, Ser: 0.71 mg/dL (ref 0.61–1.24)
GFR calc Af Amer: >60 mL/min (ref >60)
GFR calc non Af Amer: >60 mL/min (ref >60)
Glucose, Bld: 195 mg/dL — ABNORMAL HIGH (ref 70–99)
Potassium: 4.4 mmol/L (ref 3.5–5.1)
Sodium: 139 mmol/L (ref 135–145)
Total Bilirubin: 0.5 mg/dL (ref 0.3–1.2)
Total Protein: 6.2 g/dL — ABNORMAL LOW (ref 6.5–8.1)

## 2018-10-01 LAB — CBC WITH DIFFERENTIAL/PLATELET
Abs Immature Granulocytes: 0.08 10*3/uL — ABNORMAL HIGH (ref 0.00–0.07)
Basophils Absolute: 0 10*3/uL (ref 0.0–0.1)
Basophils Relative: 0 %
Eosinophils Absolute: 0 10*3/uL (ref 0.0–0.5)
Eosinophils Relative: 0 %
HCT: 40.7 % (ref 39.0–52.0)
Hemoglobin: 13.4 g/dL (ref 13.0–17.0)
Immature Granulocytes: 1 %
Lymphocytes Relative: 6 %
Lymphs Abs: 0.5 10*3/uL — ABNORMAL LOW (ref 0.7–4.0)
MCH: 31.2 pg (ref 26.0–34.0)
MCHC: 32.9 g/dL (ref 30.0–36.0)
MCV: 94.7 fL (ref 80.0–100.0)
Monocytes Absolute: 0.2 10*3/uL (ref 0.1–1.0)
Monocytes Relative: 3 %
Neutro Abs: 7.5 10*3/uL (ref 1.7–7.7)
Neutrophils Relative %: 90 %
Platelets: 239 10*3/uL (ref 150–400)
RBC: 4.3 MIL/uL (ref 4.22–5.81)
RDW: 13.8 % (ref 11.5–15.5)
WBC: 8.2 10*3/uL (ref 4.0–10.5)
nRBC: 0 % (ref 0.0–0.2)

## 2018-10-01 LAB — INTERLEUKIN-6, PLASMA: Interleukin-6, Plasma: 166.4 pg/mL — ABNORMAL HIGH (ref 0.0–12.2)

## 2018-10-01 LAB — FERRITIN: Ferritin: 1013 ng/mL — ABNORMAL HIGH (ref 24–336)

## 2018-10-01 LAB — ABO/RH: ABO/RH(D): A POS

## 2018-10-01 LAB — D-DIMER, QUANTITATIVE: D-Dimer, Quant: 1.47 ug/mL-FEU — ABNORMAL HIGH (ref 0.00–0.50)

## 2018-10-01 LAB — C-REACTIVE PROTEIN: CRP: 16.7 mg/dL — ABNORMAL HIGH (ref <1.0)

## 2018-10-01 MED ORDER — SODIUM CHLORIDE 0.9% IV SOLUTION: Freq: Once | INTRAVENOUS | Status: DC

## 2018-10-01 MED ORDER — SALINE SPRAY 0.65 % NA SOLN
1.0000 | NASAL | Status: DC | PRN
  Filled 2018-10-01: qty 44

## 2018-10-01 NOTE — Progress Notes (Addendum)
0424-  88-90% 6LNC. Utilized [employer] Interpreter to instruct the patient on prone positioning for increased oxygenation. Patient is agreeable. Questions encouraged and answered. Will continue to monitor.  0533-  Patient has remains proned for 1 hour. Encouraged to continue with this positioning as long as he can tolerate. SPO2 96-99% 6LNC with RR 20. RR was 22-30 prior to proning. Will wean while in this position for comfort of nasal passages. Will continue to monitor as patient transitions to other positions.  Text message sent to daughter Barry Brunner to update.

## 2018-10-01 NOTE — Progress Notes (Signed)
PROGRESS NOTE  Charles Arias [id number] DOB: [birth year] DOA: 09/29/2018 PCP: Patient, No Pcp Per   LOS: 1 day   Brief Narrative / Interim history: 63 year old male with history of asthma but otherwise fairly healthy on no medications at home comes to the hospital with 4 days history of fever chills, body aches, worsening shortness of breath as well as loss of smell and taste  Subjective: -He feels about the same however his oxygen requirements have gone up overnight, he was on 2 L yesterday and is on 6 L this morning.  Sats were in the upper 80s and required prompting.  He denies any chest pain, denies any abdominal pain, nausea or vomiting  Assessment & Plan: Principal Problem:   Acute respiratory disease due to COVID-19 virus Active Problems:   Acute respiratory failure with hypoxia (HCC)   Principal Problem Acute Hypoxic Respiratory Failure due to Covid-19 Viral Illness -Patient admitted to hospital with hypoxic respiratory failure, fever, and COVID-19 viral pneumonia -Patient was started on Remdesivir on 6/15, continue -Start steroids on 6/15, continue -Due to elevated inflammatory markers along with tachypnea and chest x-ray findings he received Actemra on 6/15 -Oxygenation got worse and his oxygen requirements increased overnight, his CRP remains elevated and in fact increasing her weight, patient was consented and given plasma on 6/16  The treatment plan and use of medications and known side effects were discussed with patient/family, they were clearly explained that there is no proven definitive treatment for COVID-19 infection, any medications used here are based on published clinical articles/anecdotal data which are not peer-reviewed or randomized control trials.  Complete risks and long-term side effects are unknown, however in the best clinical judgment they seem to be of some clinical benefit rather than medical risks.  Patient agree with the treatment plan and want to  receive the given medications.  COVID-19 Labs  Recent Labs    09/30/18 0207 10/01/18 0320  DDIMER 1.56* 1.47*  FERRITIN 1,062* 1,013*  LDH 525* 446*  CRP 16.1* 16.7*    Lab Results  Component Value Date   SARSCOV2NAA DETECTED (A) 09/29/2018   Active Problems History of asthma -No wheezing today, continue albuterol inhaler, steroids  Elevated LFTs -Likely in the setting of viral illness, LFTs improving a little bit today  Scheduled Meds: . sodium chloride   Intravenous Once  . enoxaparin (LOVENOX) injection  40 mg Subcutaneous Q24H  . methylPREDNISolone (SOLU-MEDROL) injection  40 mg Intravenous Q12H  . sodium chloride flush  3 mL Intravenous Once   Continuous Infusions: . remdesivir 100 mg in NS 250 mL 100 mg (09/30/18 2357)   PRN Meds:.acetaminophen, albuterol, ondansetron **OR** ondansetron (ZOFRAN) IV  DVT prophylaxis: Lovenox Code Status: Full code Family Communication: No family at bedside, discussed with patient via interpreter Disposition Plan: Home when ready  Consultants:   None  Procedures:   None   Antimicrobials:  Remdesivir   Objective: Vitals:   10/01/18 0500 10/01/18 0600 10/01/18 0646 10/01/18 0800  BP: 121/74     Pulse: 81 82 81 77  Resp: (!) 32 (!) 22 (!) 27   Temp: 97.9 F (36.6 C)   99 F (37.2 C)  TempSrc: Oral     SpO2: 99% 98% 95% 98%  Weight:      Height:        Intake/Output Summary (Last 24 hours) at 10/01/2018 1119 Last data filed at 09/30/2018 2145 Gross per 24 hour  Intake 700 ml  Output 1000 ml  Net -300 ml  Filed Weights   09/29/18 2247  Weight: 70.3 kg    Examination:  Constitutional: Appears comfortable, sitting up in bed Eyes: No scleral icterus ENMT: Moist mucous membranes, no exudates Neck: normal, supple Respiratory: Bibasilar rhonchi, no wheezing, no crackles, tachypnea present.  Moves air well. Cardiovascular: regular rate and rhythm, no murmurs.  No peripheral edema Abdomen: Soft, NT, ND,  bowel sounds positive Musculoskeletal: no clubbing / cyanosis.  Skin: No rashes seen Neurologic: Nonfocal, equal strength Psychiatric: Alert and oriented x3  Data Reviewed: I have independently reviewed following labs and imaging studies    CBC: Recent Labs  Lab 09/29/18 2116 10/01/18 0320  WBC 6.1 8.2  NEUTROABS  --  7.5  HGB 13.9 13.4  HCT 42.1 40.7  MCV 94.2 94.7  PLT 211 239   Basic Metabolic Panel: Recent Labs  Lab 09/29/18 2116 10/01/18 0320  NA 137 139  K 4.0 4.4  CL 104 103  CO2 23 25  GLUCOSE 121* 195*  BUN 16 24*  CREATININE 0.84 0.71  CALCIUM 8.6* 8.6*   GFR: Estimated Creatinine Clearance: 81.1 mL/min (by C-G formula based on SCr of 0.71 mg/dL). Liver Function Tests: Recent Labs  Lab 09/29/18 2116 10/01/18 0320  AST 121* 78*  ALT 182* 152*  ALKPHOS 105 113  BILITOT 0.4 0.5  PROT 6.2* 6.2*  ALBUMIN 2.6* 2.7*   No results for input(s): LIPASE, AMYLASE in the last 168 hours. No results for input(s): AMMONIA in the last 168 hours. Coagulation Profile: No results for input(s): INR, PROTIME in the last 168 hours. Cardiac Enzymes: Recent Labs  Lab 09/29/18 2116  TROPONINI <0.03   BNP (last 3 results) No results for input(s): PROBNP in the last 8760 hours. HbA1C: No results for input(s): HGBA1C in the last 72 hours. CBG: No results for input(s): GLUCAP in the last 168 hours. Lipid Profile: No results for input(s): CHOL, HDL, LDLCALC, TRIG, CHOLHDL, LDLDIRECT in the last 72 hours. Thyroid Function Tests: No results for input(s): TSH, T4TOTAL, FREET4, T3FREE, THYROIDAB in the last 72 hours. Anemia Panel: Recent Labs    09/30/18 0207 10/01/18 0320  FERRITIN 1,062* 1,013*   Urine analysis:    Component Value Date/Time   COLORURINE YELLOW 09/30/2018 0239   APPEARANCEUR CLEAR 09/30/2018 0239   LABSPEC 1.016 09/30/2018 0239   PHURINE 6.0 09/30/2018 0239   GLUCOSEU NEGATIVE 09/30/2018 0239   HGBUR NEGATIVE 09/30/2018 0239   BILIRUBINUR  NEGATIVE 09/30/2018 0239   KETONESUR NEGATIVE 09/30/2018 0239   PROTEINUR 30 (A) 09/30/2018 0239   NITRITE NEGATIVE 09/30/2018 0239   LEUKOCYTESUR NEGATIVE 09/30/2018 0239   Sepsis Labs: Invalid input(s): PROCALCITONIN, LACTICIDVEN  Recent Results (from the past 240 hour(s))  SARS Coronavirus 2     Status: Abnormal   Collection Time: 09/29/18  9:40 PM  Result Value Ref Range Status   SARS Coronavirus 2 DETECTED (A) NOT DETECTED Final    Comment: RESULT CALLED TO, READ BACK BY AND VERIFIED WITH: RN J LEBRON @2325  09/29/18 BY S GEZAHEGN (NOTE) SARS-CoV-2 target nucleic acids are DETECTED. The SARS-CoV-2 RNA is generally detectable in upper and lower respiratory specimens during the acute phase of infection. Positive results are indicative of active infection with SARS-CoV-2. Clinical  correlation with patient history and other diagnostic information is necessary to determine patient infection status. Positive results do  not rule out bacterial infection or co-infection with other viruses. The expected result is Not Detected. Fact Sheet for Patients: http://www.biofiredefense.com/wp-content/uploads/2020/03/BIOFIRE-COVID -19-patients.pdf Fact Sheet for Healthcare Providers: http://www.biofiredefense.com/wp-content/uploads/2020/03/BIOFIRE-COVID -19-hcp.pdf  This test is not yet approved or cleared by the [place] and  has been authorized for detection and/or diagnosis of SARS-CoV-2 by FDA under an Patent examiner y Insurance account manager (EUA).  This EUA will remain in effect (meaning this test can be used) for the duration of  the COVID-19 declaration under Section 564(b)(1) of the Act, 21 U.S.C. section [phone] 3(b)(1), unless the authorization is terminated or revoked sooner. Performed at [facility], [place] [address], [place], [place] [address]       Radiology Studies: Dg Chest Portable 1 View  Result Date: 09/29/2018 CLINICAL DATA:  Shortness of breath, chest pain EXAM:  PORTABLE CHEST 1 VIEW COMPARISON:  None. FINDINGS: Heart is upper limits normal in size. Suspect patchy bilateral airspace opacities. No effusions. No acute bony abnormality. IMPRESSION: Patchy bilateral airspace opacities concerning for pneumonia. Electronically Signed   By: Rolm Baptise M.D.   On: 09/29/2018 21:35     Marzetta Board, MD, PhD [facility]  Contact via  [email or web address]  [place] P: [phone]  F: [phone]

## 2018-10-02 LAB — CBC WITH DIFFERENTIAL/PLATELET
Abs Immature Granulocytes: 0.06 K/uL (ref 0.00–0.07)
Basophils Absolute: 0 K/uL (ref 0.0–0.1)
Basophils Relative: 0 %
Eosinophils Absolute: 0 K/uL (ref 0.0–0.5)
Eosinophils Relative: 0 %
HCT: 41.3 % (ref 39.0–52.0)
Hemoglobin: 13 g/dL (ref 13.0–17.0)
Immature Granulocytes: 1 %
Lymphocytes Relative: 5 %
Lymphs Abs: 0.6 K/uL — ABNORMAL LOW (ref 0.7–4.0)
MCH: 29.8 pg (ref 26.0–34.0)
MCHC: 31.5 g/dL (ref 30.0–36.0)
MCV: 94.7 fL (ref 80.0–100.0)
Monocytes Absolute: 0.3 K/uL (ref 0.1–1.0)
Monocytes Relative: 2 %
Neutro Abs: 10.1 K/uL — ABNORMAL HIGH (ref 1.7–7.7)
Neutrophils Relative %: 92 %
Platelets: 308 K/uL (ref 150–400)
RBC: 4.36 MIL/uL (ref 4.22–5.81)
RDW: 13.3 % (ref 11.5–15.5)
WBC: 11 K/uL — ABNORMAL HIGH (ref 4.0–10.5)
nRBC: 0 % (ref 0.0–0.2)

## 2018-10-02 LAB — COMPREHENSIVE METABOLIC PANEL
ALT: 149 U/L — ABNORMAL HIGH (ref 0–44)
AST: 72 U/L — ABNORMAL HIGH (ref 15–41)
Albumin: 2.7 g/dL — ABNORMAL LOW (ref 3.5–5.0)
Alkaline Phosphatase: 123 U/L (ref 38–126)
Anion gap: 10 (ref 5–15)
BUN: 26 mg/dL — ABNORMAL HIGH (ref 8–23)
CO2: 25 mmol/L (ref 22–32)
Calcium: 8.6 mg/dL — ABNORMAL LOW (ref 8.9–10.3)
Chloride: 102 mmol/L (ref 98–111)
Creatinine, Ser: 0.75 mg/dL (ref 0.61–1.24)
GFR calc Af Amer: >60 mL/min (ref >60)
GFR calc non Af Amer: >60 mL/min (ref >60)
Glucose, Bld: 196 mg/dL — ABNORMAL HIGH (ref 70–99)
Potassium: 4.5 mmol/L (ref 3.5–5.1)
Sodium: 137 mmol/L (ref 135–145)
Total Bilirubin: 0.5 mg/dL (ref 0.3–1.2)
Total Protein: 5.9 g/dL — ABNORMAL LOW (ref 6.5–8.1)

## 2018-10-02 LAB — D-DIMER, QUANTITATIVE: D-Dimer, Quant: 1.12 ug/mL-FEU — ABNORMAL HIGH (ref 0.00–0.50)

## 2018-10-02 LAB — FERRITIN: Ferritin: 869 ng/mL — ABNORMAL HIGH (ref 24–336)

## 2018-10-02 LAB — LACTATE DEHYDROGENASE: LDH: 428 U/L — ABNORMAL HIGH (ref 98–192)

## 2018-10-02 LAB — C-REACTIVE PROTEIN: CRP: 8.5 mg/dL — ABNORMAL HIGH (ref <1.0)

## 2018-10-02 NOTE — Progress Notes (Signed)
[place] TEAM 1 - Stepdown/ICU TEAM  Dannel Rafter  BEM:[id number] DOB: 1955-12-03 DOA: 09/29/2018 PCP: Patient, No Pcp Per    Brief Narrative:  63yo male with a hx of asthma who presented w/ a 4 day history of fever chills, body aches, shortness of breath, and loss of smell and taste  Significant Events: 6/15 admit via [place]  COVID-19 specific Treatment: Remdesivir 6/15 > Steroids 6/15 > Actemra 6/15 >  Subjective: Sitting up in bedside chair. Appears comfortable. Through the ipad interpreter he tells me he feels much better today. He is less SOB. Denies cp, n/v, abdom pain. Is able to complete full sentences w/o difficulty.   Assessment & Plan:  Acute Hypoxic Respiratory Failure - COVID Pneumonia  Has been dosed w/ Actemra - currently receiving steroids and Remdesivir - consented for convalescent plasma, but has not gotten yet and w/ significant clinical improvement will cancel infusion for now   Recent Labs    09/30/18 0207 10/01/18 0320 10/02/18 0243  DDIMER 1.56* 1.47* 1.12*  FERRITIN 1,062* 1,013* 869*  LDH 525* 446* 428*  CRP 16.1* 16.7* 8.5*    Asthma Well compensated at this time   Elevated LFTs Stable/improving at this time - follow while on Remdesivir   Recent Labs  Lab 09/29/18 2116 10/01/18 0320 10/02/18 0243  AST 121* 78* 72*  ALT 182* 152* 149*  ALKPHOS 105 113 123  BILITOT 0.4 0.5 0.5  PROT 6.2* 6.2* 5.9*  ALBUMIN 2.6* 2.7* 2.7*    DVT prophylaxis: lovenox  Code Status: FULL CODE Family Communication: spoke w/ daughter via phone  Disposition Plan: progressive care - wean O2 as able   Consultants:  none  Antimicrobials:  none  Objective: Blood pressure (!) 154/85, pulse 84, temperature 98.6 F (37 C), resp. rate (!) 25, height 5' 2.99" (1.6 m), weight 70.3 kg, SpO2 94 %.  Intake/Output Summary (Last 24 hours) at 10/02/2018 1140 Last data filed at 10/02/2018 0714 Gross per 24 hour  Intake 790 ml  Output 1200 ml  Net -410 ml    Filed Weights   09/29/18 2247  Weight: 70.3 kg    Examination: General: No acute respiratory distress Lungs: bibasilar crackles - no wheezing Cardiovascular: Regular rate and rhythm without murmur gallop or rub normal S1 and S2 Abdomen: Nontender, nondistended, soft, bowel sounds positive, no rebound, no ascites, no appreciable mass Extremities: No significant cyanosis, clubbing, or edema bilateral lower extremities  CBC: Recent Labs  Lab 09/29/18 2116 10/01/18 0320 10/02/18 0243  WBC 6.1 8.2 11.0*  NEUTROABS  --  7.5 10.1*  HGB 13.9 13.4 13.0  HCT 42.1 40.7 41.3  MCV 94.2 94.7 94.7  PLT 211 239 071   Basic Metabolic Panel: Recent Labs  Lab 09/29/18 2116 10/01/18 0320 10/02/18 0243  NA 137 139 137  K 4.0 4.4 4.5  CL 104 103 102  CO2 23 25 25   GLUCOSE 121* 195* 196*  BUN 16 24* 26*  CREATININE 0.84 0.71 0.75  CALCIUM 8.6* 8.6* 8.6*   GFR: Estimated Creatinine Clearance: 81.1 mL/min (by C-G formula based on SCr of 0.75 mg/dL).  Liver Function Tests: Recent Labs  Lab 09/29/18 2116 10/01/18 0320 10/02/18 0243  AST 121* 78* 72*  ALT 182* 152* 149*  ALKPHOS 105 113 123  BILITOT 0.4 0.5 0.5  PROT 6.2* 6.2* 5.9*  ALBUMIN 2.6* 2.7* 2.7*   Cardiac Enzymes: Recent Labs  Lab 09/29/18 2116  TROPONINI <0.03    Recent Results (from the past 240  hour(s))  Blood Culture (routine x 2)     Status: None (Preliminary result)   Collection Time: 09/29/18  9:16 PM   Specimen: BLOOD RIGHT HAND  Result Value Ref Range Status   Specimen Description BLOOD RIGHT HAND  Final   Special Requests   Final    BOTTLES DRAWN AEROBIC AND ANAEROBIC Blood Culture results may not be optimal due to an inadequate volume of blood received in culture bottles   Culture   Final    NO GROWTH 2 DAYS Performed at [facility], [address] [place], [place] [address]    Report Status PENDING  Incomplete  Blood Culture (routine x 2)     Status: None (Preliminary result)    Collection Time: 09/29/18  9:40 PM   Specimen: BLOOD  Result Value Ref Range Status   Specimen Description BLOOD LEFT ANTECUBITAL  Final   Special Requests   Final    BOTTLES DRAWN AEROBIC ONLY Blood Culture results may not be optimal due to an excessive volume of blood received in culture bottles   Culture   Final    NO GROWTH 2 DAYS Performed at [facility], [address] [place], [place] [address]    Report Status PENDING  Incomplete  SARS Coronavirus 2     Status: Abnormal   Collection Time: 09/29/18  9:40 PM  Result Value Ref Range Status   SARS Coronavirus 2 DETECTED (A) NOT DETECTED Final    Comment: RESULT CALLED TO, READ BACK BY AND VERIFIED WITH: RN J LEBRON @2325  09/29/18 BY S GEZAHEGN (NOTE) SARS-CoV-2 target nucleic acids are DETECTED. The SARS-CoV-2 RNA is generally detectable in upper and lower respiratory specimens during the acute phase of infection. Positive results are indicative of active infection with SARS-CoV-2. Clinical  correlation with patient history and other diagnostic information is necessary to determine patient infection status. Positive results do  not rule out bacterial infection or co-infection with other viruses. The expected result is Not Detected. Fact Sheet for Patients: http://www.biofiredefense.com/wp-content/uploads/2020/03/BIOFIRE-COVID -19-patients.pdf Fact Sheet for Healthcare Providers: http://www.biofiredefense.com/wp-content/uploads/2020/03/BIOFIRE-COVID -19-hcp.pdf This test is not yet approved or cleared by the [place] FDA and  has been authorized for detection and/or diagnosis of SARS-CoV-2 by FDA under an Lexicographermergenc y Investment banker, corporateUse Authorization (EUA).  This EUA will remain in effect (meaning this test can be used) for the duration of  the COVID-19 declaration under Section 564(b)(1) of the Act, 21 U.S.C. section [phone] 3(b)(1), unless the authorization is terminated or revoked sooner. Performed at [facility],  [address] [place], [place] [address]      Scheduled Meds: . sodium chloride   Intravenous Once  . enoxaparin (LOVENOX) injection  40 mg Subcutaneous Q24H  . methylPREDNISolone (SOLU-MEDROL) injection  40 mg Intravenous Q12H  . sodium chloride flush  3 mL Intravenous Once   Continuous Infusions: . remdesivir 100 mg in NS 250 mL Stopped (10/02/18 0102)     LOS: 2 days   Lonia BloodJeffrey T. Dakayla Disanti, MD [facility] Office  [phone] Pager - Text Page per Loretha StaplerAmion  If 7PM-7AM, please contact night-coverage per Amion 10/02/2018, 11:40 AM

## 2018-10-03 LAB — CBC WITH DIFFERENTIAL/PLATELET
Abs Immature Granulocytes: 0.09 10*3/uL — ABNORMAL HIGH (ref 0.00–0.07)
Basophils Absolute: 0 10*3/uL (ref 0.0–0.1)
Basophils Relative: 0 %
Eosinophils Absolute: 0 10*3/uL (ref 0.0–0.5)
Eosinophils Relative: 0 %
HCT: 41.4 % (ref 39.0–52.0)
Hemoglobin: 13.5 g/dL (ref 13.0–17.0)
Immature Granulocytes: 1 %
Lymphocytes Relative: 5 %
Lymphs Abs: 0.5 10*3/uL — ABNORMAL LOW (ref 0.7–4.0)
MCH: 30.8 pg (ref 26.0–34.0)
MCHC: 32.6 g/dL (ref 30.0–36.0)
MCV: 94.5 fL (ref 80.0–100.0)
Monocytes Absolute: 0.2 10*3/uL (ref 0.1–1.0)
Monocytes Relative: 2 %
Neutro Abs: 8.4 10*3/uL — ABNORMAL HIGH (ref 1.7–7.7)
Neutrophils Relative %: 92 %
Platelets: 328 10*3/uL (ref 150–400)
RBC: 4.38 MIL/uL (ref 4.22–5.81)
RDW: 13.3 % (ref 11.5–15.5)
WBC: 9.2 10*3/uL (ref 4.0–10.5)
nRBC: 0 % (ref 0.0–0.2)

## 2018-10-03 LAB — LACTATE DEHYDROGENASE: LDH: 419 U/L — ABNORMAL HIGH (ref 98–192)

## 2018-10-03 LAB — COMPREHENSIVE METABOLIC PANEL
ALT: 191 U/L — ABNORMAL HIGH (ref 0–44)
AST: 75 U/L — ABNORMAL HIGH (ref 15–41)
Albumin: 2.6 g/dL — ABNORMAL LOW (ref 3.5–5.0)
Alkaline Phosphatase: 126 U/L (ref 38–126)
Anion gap: 11 (ref 5–15)
BUN: 27 mg/dL — ABNORMAL HIGH (ref 8–23)
CO2: 26 mmol/L (ref 22–32)
Calcium: 8.6 mg/dL — ABNORMAL LOW (ref 8.9–10.3)
Chloride: 102 mmol/L (ref 98–111)
Creatinine, Ser: 0.76 mg/dL (ref 0.61–1.24)
GFR calc Af Amer: >60 mL/min (ref >60)
GFR calc non Af Amer: >60 mL/min (ref >60)
Glucose, Bld: 185 mg/dL — ABNORMAL HIGH (ref 70–99)
Potassium: 4.7 mmol/L (ref 3.5–5.1)
Sodium: 139 mmol/L (ref 135–145)
Total Bilirubin: 0.4 mg/dL (ref 0.3–1.2)
Total Protein: 5.7 g/dL — ABNORMAL LOW (ref 6.5–8.1)

## 2018-10-03 LAB — FERRITIN: Ferritin: 716 ng/mL — ABNORMAL HIGH (ref 24–336)

## 2018-10-03 LAB — D-DIMER, QUANTITATIVE: D-Dimer, Quant: 0.97 ug/mL-FEU — ABNORMAL HIGH (ref 0.00–0.50)

## 2018-10-03 LAB — C-REACTIVE PROTEIN: CRP: 4 mg/dL — ABNORMAL HIGH (ref <1.0)

## 2018-10-03 MED ORDER — FUROSEMIDE 10 MG/ML IJ SOLN
40.0000 mg | Freq: Once | INTRAMUSCULAR | Status: AC
  Administered 2018-10-03: 40 mg via INTRAVENOUS
  Filled 2018-10-03: qty 4

## 2018-10-03 NOTE — Progress Notes (Signed)
[place] TEAM 1 - Stepdown/ICU TEAM  Francie MassingJavier Uribe  ZOX:[id number] DOB: 1956-03-15 DOA: 09/29/2018 PCP: Patient, No Pcp Per    Brief Narrative:  63yo male with a hx of asthma who presented w/ a 4 day history of fever chills, body aches, shortness of breath, and loss of smell and taste.  Significant Events: 6/15 admit via [place]  COVID-19 specific Treatment: Remdesivir 6/15 > Steroids 6/15 > Actemra 6/15   Subjective: O2 requirement slightly higher this morning.  Despite this the patient appears comfortable sitting on the side of the bed.  He denies chest pain nausea vomiting or abdominal pain.  He tells me that he feels stable.  Assessment & Plan:  Acute Hypoxic Respiratory Failure - COVID Pneumonia  Has been dosed w/ Actemra - currently receiving steroids and Remdesivir - consented for convalescent plasma, but w/ significant clinical improvement and lack of available plasma infusion was canceled -remains somewhat tenuous clinically but we may be witnessing the onset of stabilization  Recent Labs    10/01/18 0320 10/02/18 0243 10/03/18 0400  DDIMER 1.47* 1.12* 0.97*  FERRITIN 1,013* 869* 716*  LDH 446* 428* 419*  CRP 16.7* 8.5* 4.0*    Asthma No wheezing today  Elevated LFTs Stable/improving at this time - follow while on Remdesivir   Recent Labs  Lab 09/29/18 2116 10/01/18 0320 10/02/18 0243 10/03/18 0400  AST 121* 78* 72* 75*  ALT 182* 152* 149* 191*  ALKPHOS 105 113 123 126  BILITOT 0.4 0.5 0.5 0.4  PROT 6.2* 6.2* 5.9* 5.7*  ALBUMIN 2.6* 2.7* 2.7* 2.6*    DVT prophylaxis: lovenox  Code Status: FULL CODE Family Communication:  Disposition Plan: progressive care - wean O2 as able   Consultants:  none  Antimicrobials:  none  Objective: Blood pressure 124/79, pulse 81, temperature 98.6 F (37 C), resp. rate (!) 22, height 5' 2.99" (1.6 m), weight 70.3 kg, SpO2 (!) 89 %.  Intake/Output Summary (Last 24 hours) at 10/03/2018 0919 Last data filed at  10/03/2018 0520 Gross per 24 hour  Intake 265.97 ml  Output 675 ml  Net -409.03 ml   Filed Weights   09/29/18 2247  Weight: 70.3 kg    Examination: General: No acute respiratory distress when sitting Lungs: Faint basilar crackles bilaterally with no wheezing Cardiovascular: RRR without murmur or rub Abdomen: NT/ND, soft, no rebound, bowel sounds positive Extremities: No C/C/E bilateral lower extremities  CBC: Recent Labs  Lab 10/01/18 0320 10/02/18 0243 10/03/18 0400  WBC 8.2 11.0* 9.2  NEUTROABS 7.5 10.1* 8.4*  HGB 13.4 13.0 13.5  HCT 40.7 41.3 41.4  MCV 94.7 94.7 94.5  PLT 239 308 328   Basic Metabolic Panel: Recent Labs  Lab 10/01/18 0320 10/02/18 0243 10/03/18 0400  NA 139 137 139  K 4.4 4.5 4.7  CL 103 102 102  CO2 25 25 26   GLUCOSE 195* 196* 185*  BUN 24* 26* 27*  CREATININE 0.71 0.75 0.76  CALCIUM 8.6* 8.6* 8.6*   GFR: Estimated Creatinine Clearance: 81.1 mL/min (by C-G formula based on SCr of 0.76 mg/dL).  Liver Function Tests: Recent Labs  Lab 09/29/18 2116 10/01/18 0320 10/02/18 0243 10/03/18 0400  AST 121* 78* 72* 75*  ALT 182* 152* 149* 191*  ALKPHOS 105 113 123 126  BILITOT 0.4 0.5 0.5 0.4  PROT 6.2* 6.2* 5.9* 5.7*  ALBUMIN 2.6* 2.7* 2.7* 2.6*   Cardiac Enzymes: Recent Labs  Lab 09/29/18 2116  TROPONINI <0.03    Recent Results (from  the past 240 hour(s))  Blood Culture (routine x 2)     Status: None (Preliminary result)   Collection Time: 09/29/18  9:16 PM   Specimen: BLOOD RIGHT HAND  Result Value Ref Range Status   Specimen Description BLOOD RIGHT HAND  Final   Special Requests   Final    BOTTLES DRAWN AEROBIC AND ANAEROBIC Blood Culture results may not be optimal due to an inadequate volume of blood received in culture bottles   Culture   Final    NO GROWTH 3 DAYS Performed at [facility], [place] [address], [place], [place] [address]    Report Status PENDING  Incomplete  Blood Culture (routine x 2)     Status: None  (Preliminary result)   Collection Time: 09/29/18  9:40 PM   Specimen: BLOOD  Result Value Ref Range Status   Specimen Description BLOOD LEFT ANTECUBITAL  Final   Special Requests   Final    BOTTLES DRAWN AEROBIC ONLY Blood Culture results may not be optimal due to an excessive volume of blood received in culture bottles   Culture   Final    NO GROWTH 3 DAYS Performed at [facility], [place] [address] [place], [place] [address]    Report Status PENDING  Incomplete  SARS Coronavirus 2     Status: Abnormal   Collection Time: 09/29/18  9:40 PM  Result Value Ref Range Status   SARS Coronavirus 2 DETECTED (A) NOT DETECTED Final    Comment: RESULT CALLED TO, READ BACK BY AND VERIFIED WITH: RN J LEBRON @2325  09/29/18 BY S GEZAHEGN (NOTE) SARS-CoV-2 target nucleic acids are DETECTED. The SARS-CoV-2 RNA is generally detectable in upper and lower respiratory specimens during the acute phase of infection. Positive results are indicative of active infection with SARS-CoV-2. Clinical  correlation with patient history and other diagnostic information is necessary to determine patient infection status. Positive results do  not rule out bacterial infection or co-infection with other viruses. The expected result is Not Detected. Fact Sheet for Patients: http://www.biofiredefense.com/wp-content/uploads/2020/03/BIOFIRE-COVID -19-patients.pdf Fact Sheet for Healthcare Providers: http://www.biofiredefense.com/wp-content/uploads/2020/03/BIOFIRE-COVID -19-hcp.pdf This test is not yet approved or cleared by the [place] and  has been authorized for detection and/or diagnosis of SARS-CoV-2 by FDA under an Patent examiner y Insurance account manager (EUA).  This EUA will remain in effect (meaning this test can be used) for the duration of  the COVID-19 declaration under Section 564(b)(1) of the Act, 21 U.S.C. section [id number] 3(b)(1), unless the authorization is terminated or revoked sooner. Performed at  [facility], [place] [address] [place], [place] [address]      Scheduled Meds: . enoxaparin (LOVENOX) injection  40 mg Subcutaneous Q24H  . methylPREDNISolone (SOLU-MEDROL) injection  40 mg Intravenous Q12H  . sodium chloride flush  3 mL Intravenous Once   Continuous Infusions: . remdesivir 100 mg in NS 250 mL Stopped (10/03/18 0003)     LOS: 3 days   Cherene Altes, MD [facility] Office  [phone] Pager - Text Page per Shea Evans  If 7PM-7AM, please contact night-coverage per Amion 10/03/2018, 9:19 AM

## 2018-10-03 NOTE — Care Management Important Message (Signed)
Important Message  Patient Details  Name: Charles Arias MRN: [id number] Date of Birth: 01/18/1956   Medicare Important Message Given:  Yes - Important Message mailed due to current National Emergency   Verbal consent obtained due to current National Emergency  Relationship to patient: Self Contact Name: Home number not on file Call Date: 10/03/18  Time: 4103   Outcome: Missing or Invalid Number Important Message mailed to: Other (must enter comment)(No Address on file)     Orbie Pyo 10/03/2018, 3:49 PM

## 2018-10-04 LAB — COMPREHENSIVE METABOLIC PANEL
ALT: 147 U/L — ABNORMAL HIGH (ref 0–44)
AST: 37 U/L (ref 15–41)
Albumin: 2.8 g/dL — ABNORMAL LOW (ref 3.5–5.0)
Alkaline Phosphatase: 126 U/L (ref 38–126)
Anion gap: 12 (ref 5–15)
BUN: 29 mg/dL — ABNORMAL HIGH (ref 8–23)
CO2: 27 mmol/L (ref 22–32)
Calcium: 8.6 mg/dL — ABNORMAL LOW (ref 8.9–10.3)
Chloride: 97 mmol/L — ABNORMAL LOW (ref 98–111)
Creatinine, Ser: 0.77 mg/dL (ref 0.61–1.24)
GFR calc Af Amer: >60 mL/min (ref >60)
GFR calc non Af Amer: >60 mL/min (ref >60)
Glucose, Bld: 230 mg/dL — ABNORMAL HIGH (ref 70–99)
Potassium: 4.7 mmol/L (ref 3.5–5.1)
Sodium: 136 mmol/L (ref 135–145)
Total Bilirubin: 0.4 mg/dL (ref 0.3–1.2)
Total Protein: 6 g/dL — ABNORMAL LOW (ref 6.5–8.1)

## 2018-10-04 LAB — CBC WITH DIFFERENTIAL/PLATELET
Abs Immature Granulocytes: 0.14 10*3/uL — ABNORMAL HIGH (ref 0.00–0.07)
Basophils Absolute: 0 10*3/uL (ref 0.0–0.1)
Basophils Relative: 0 %
Eosinophils Absolute: 0 10*3/uL (ref 0.0–0.5)
Eosinophils Relative: 0 %
HCT: 45 % (ref 39.0–52.0)
Hemoglobin: 14.5 g/dL (ref 13.0–17.0)
Immature Granulocytes: 1 %
Lymphocytes Relative: 5 %
Lymphs Abs: 0.5 10*3/uL — ABNORMAL LOW (ref 0.7–4.0)
MCH: 30.1 pg (ref 26.0–34.0)
MCHC: 32.2 g/dL (ref 30.0–36.0)
MCV: 93.4 fL (ref 80.0–100.0)
Monocytes Absolute: 0.3 10*3/uL (ref 0.1–1.0)
Monocytes Relative: 3 %
Neutro Abs: 10.1 10*3/uL — ABNORMAL HIGH (ref 1.7–7.7)
Neutrophils Relative %: 91 %
Platelets: 386 10*3/uL (ref 150–400)
RBC: 4.82 MIL/uL (ref 4.22–5.81)
RDW: 13 % (ref 11.5–15.5)
WBC: 11.1 10*3/uL — ABNORMAL HIGH (ref 4.0–10.5)
nRBC: 0 % (ref 0.0–0.2)

## 2018-10-04 LAB — CULTURE, BLOOD (ROUTINE X 2)
Culture: NO GROWTH
Culture: NO GROWTH

## 2018-10-04 LAB — LACTATE DEHYDROGENASE: LDH: 466 U/L — ABNORMAL HIGH (ref 98–192)

## 2018-10-04 LAB — HEMOGLOBIN A1C
Hgb A1c MFr Bld: 6.5 % — ABNORMAL HIGH (ref 4.8–5.6)
Mean Plasma Glucose: 139.85 mg/dL

## 2018-10-04 LAB — FERRITIN: Ferritin: 578 ng/mL — ABNORMAL HIGH (ref 24–336)

## 2018-10-04 LAB — D-DIMER, QUANTITATIVE: D-Dimer, Quant: 0.81 ug/mL-FEU — ABNORMAL HIGH (ref 0.00–0.50)

## 2018-10-04 LAB — C-REACTIVE PROTEIN: CRP: 2.3 mg/dL — ABNORMAL HIGH (ref <1.0)

## 2018-10-04 NOTE — Progress Notes (Signed)
OT Cancellation Note  Patient Details Name: Charles Arias MRN: [id number] DOB: [birth year]   Cancelled Treatment:    Reason Eval/Treat Not Completed: OT screened, no needs identified, will sign off  [place], OT/L   Acute OT Clinical Specialist Acute Rehabilitation Services Pager [phone] Office [phone]  10/04/2018, 5:42 PM

## 2018-10-04 NOTE — Evaluation (Signed)
\Physical Therapy Evaluation Patient Details Name: Charles Arias MRN: [id number] DOB: 02-17-1956 Today's Date: 10/04/2018   History of Present Illness  63yo male with a hx of asthma who presented w/ a 4 day history of fever chills, body aches, shortness of breath, and loss of smell and taste, + covid  Clinical Impression  The patient Ambulated x 800',( 700' on RA and 100' on 2 l [place]). Patient's SaO2 dropped to 88% briefly, placed on 2 L for 100',  then  Placed on RA with SaO2 staying >94%, even up to 100% while ambulating x 600' more.. This was done with sensor on forehead .Earlier in day, patient ambulated with RN on 2 L [place] with SaO2 dropping into 70's with  Sensor on the finger. Appears Forehead sensor may be more sensitive. Continue  O2 sat monitoring for supplemental O2 needs prior to Dc.Pt admitted with above diagnosis. Pt currently with functional limitations due to the deficits listed below (see PT Problem List).  Pt will benefit from skilled PT to increase their independence and safety with mobility to allow discharge to the venue listed below.       Follow Up Recommendations No PT follow up    Equipment Recommendations  None recommended by PT    Recommendations for Other Services       Precautions / Restrictions Precautions Precaution Comments: monitor sats      Mobility  Bed Mobility Overal bed mobility: Independent                Transfers Overall transfer level: Independent                  Ambulation/Gait Ambulation/Gait assistance: Independent Gait Distance (Feet): 800 Feet Assistive device: None Gait Pattern/deviations: WFL(Within Functional Limits)     General Gait Details: patient pushed O2 carrier  Stairs            Wheelchair Mobility    Modified Rankin (Stroke Patients Only)       Balance                                             Pertinent Vitals/Pain      Home Living Family/patient expects to be  discharged to:: Private residence Living Arrangements: Children Available Help at Discharge: Family Type of Home: House Home Access: Level entry     Home Layout: One level Home Equipment: None      Prior Function Level of Independence: Independent               Hand Dominance        Extremity/Trunk Assessment   Upper Extremity Assessment Upper Extremity Assessment: Overall WFL for tasks assessed    Lower Extremity Assessment Lower Extremity Assessment: Overall WFL for tasks assessed    Cervical / Trunk Assessment Cervical / Trunk Assessment: Normal  Communication   Communication: Prefers language other than English(spanish)  Cognition Arousal/Alertness: Awake/alert Behavior During Therapy: WFL for tasks assessed/performed Overall Cognitive Status: Within Functional Limits for tasks assessed                                 General Comments: [employer] interpreters x 2 used.      General Comments      Exercises     Assessment/Plan    PT Assessment Patient  needs continued PT services  PT Problem List Decreased mobility;Decreased activity tolerance       PT Treatment Interventions Gait training    PT Goals (Current goals can be found in the Care Plan section)  Acute Rehab PT Goals Patient Stated Goal: go home PT Goal Formulation: With patient Time For Goal Achievement: 10/11/18 Potential to Achieve Goals: Good    Frequency Min 3X/week   Barriers to discharge        Co-evaluation               AM-PAC PT "6 Clicks" Mobility  Outcome Measure Help needed turning from your back to your side while in a flat bed without using bedrails?: None Help needed moving from lying on your back to sitting on the side of a flat bed without using bedrails?: None Help needed moving to and from a bed to a chair (including a wheelchair)?: None Help needed standing up from a chair using your arms (e.g., wheelchair or bedside chair)?: None Help  needed to walk in hospital room?: None Help needed climbing 3-5 steps with a railing? : None 6 Click Score: 24    End of Session Equipment Utilized During Treatment: Oxygen(used 2 L for 100' out of 800') Activity Tolerance: Patient tolerated treatment well Patient left: in bed Nurse Communication: Mobility status(pt did not desat) PT Visit Diagnosis: Difficulty in walking, not elsewhere classified (R26.2)    Time: [id number] PT Time Calculation (min) (ACUTE ONLY): 47 min   Charges:   PT Evaluation $PT Eval Moderate Complexity: 1 Mod PT Treatments $Gait Training: 23-37 mins        Blanchard KelchKaren Dshawn Mcnay PT Acute Rehabilitation Services Phone [phone] Office [phone]   Rada [place], Baelynn Schmuhl Elizabeth 10/04/2018, 4:52 PM

## 2018-10-04 NOTE — Progress Notes (Signed)
[place] TEAM 1 - Stepdown/ICU TEAM  Charles MassingJavier Arias  ZOX:[id number] DOB: 20-Feb-1956 DOA: 09/29/2018 PCP: Patient, No Pcp Per    Brief Narrative:  63yo male with a hx of asthma who presented w/ a 4 day history of fever chills, body aches, shortness of breath, and loss of smell and taste.  Significant Events: 6/15 admit via [facility]  COVID-19 specific Treatment: Remdesivir 6/15 > 6/18 Steroids 6/15 > Actemra 6/15   Subjective: Remains on 4L  O2 support. Attempt to wean today. In good spirits. Reports that he is feeling much better. Denies cp, sob, n/v, or abdom pain.   Assessment & Plan:  Acute Hypoxic Respiratory Failure - COVID Pneumonia  Has been dosed w/ Actemra and Remdesivir - currently receiving steroids - consented for convalescent plasma, but w/ significant clinical improvement and lack of available plasma infusion was canceled - clinically improving at this time - attempt to wean O2 today - probable d/c home in 24hrs w/ or w/o O2 as determined   Recent Labs    10/02/18 0243 10/03/18 0400 10/04/18 0300  DDIMER 1.12* 0.97* 0.81*  FERRITIN 869* 716* 578*  LDH 428* 419* 466*  CRP 8.5* 4.0* 2.3*    Asthma No wheezing today  Elevated LFTs Stable/improving at this time - improving off Remdesivir   Recent Labs  Lab 09/29/18 2116 10/01/18 0320 10/02/18 0243 10/03/18 0400 10/04/18 0300  AST 121* 78* 72* 75* 37  ALT 182* 152* 149* 191* 147*  ALKPHOS 105 113 123 126 126  BILITOT 0.4 0.5 0.5 0.4 0.4  PROT 6.2* 6.2* 5.9* 5.7* 6.0*  ALBUMIN 2.6* 2.7* 2.7* 2.6* 2.8*    DVT prophylaxis: lovenox  Code Status: FULL CODE Family Communication:  Disposition Plan: transfer to med/surg bed - wean O2 - ?d/c next 24hrs    Consultants:  none  Antimicrobials:  none  Objective: Blood pressure 105/65, pulse 91, temperature 97.9 F (36.6 C), temperature source Oral, resp. rate 20, height 5' 2.99" (1.6 m), weight 70.3 kg, SpO2 (!) 86 %.  Intake/Output Summary (Last 24  hours) at 10/04/2018 0854 Last data filed at 10/04/2018 0740 Gross per 24 hour  Intake -  Output 2000 ml  Net -2000 ml   Filed Weights   09/29/18 2247  Weight: 70.3 kg    Examination: General: No acute respiratory distress at rest  Lungs: improved air movement th/o w/o wheezing  Cardiovascular: RRR  Abdomen: NT/ND, soft, no rebound, bowel sounds positive Extremities: No C/C/E B LE  CBC: Recent Labs  Lab 10/02/18 0243 10/03/18 0400 10/04/18 0300  WBC 11.0* 9.2 11.1*  NEUTROABS 10.1* 8.4* 10.1*  HGB 13.0 13.5 14.5  HCT 41.3 41.4 45.0  MCV 94.7 94.5 93.4  PLT 308 328 386   Basic Metabolic Panel: Recent Labs  Lab 10/02/18 0243 10/03/18 0400 10/04/18 0300  NA 137 139 136  K 4.5 4.7 4.7  CL 102 102 97*  CO2 25 26 27   GLUCOSE 196* 185* 230*  BUN 26* 27* 29*  CREATININE 0.75 0.76 0.77  CALCIUM 8.6* 8.6* 8.6*   GFR: Estimated Creatinine Clearance: 81.1 mL/min (by C-G formula based on SCr of 0.77 mg/dL).  Liver Function Tests: Recent Labs  Lab 10/01/18 0320 10/02/18 0243 10/03/18 0400 10/04/18 0300  AST 78* 72* 75* 37  ALT 152* 149* 191* 147*  ALKPHOS 113 123 126 126  BILITOT 0.5 0.5 0.4 0.4  PROT 6.2* 5.9* 5.7* 6.0*  ALBUMIN 2.7* 2.7* 2.6* 2.8*   Cardiac Enzymes: Recent Labs  Lab 09/29/18 2116  TROPONINI <0.03    Recent Results (from the past 240 hour(s))  Blood Culture (routine x 2)     Status: None   Collection Time: 09/29/18  9:16 PM   Specimen: BLOOD RIGHT HAND  Result Value Ref Range Status   Specimen Description BLOOD RIGHT HAND  Final   Special Requests   Final    BOTTLES DRAWN AEROBIC AND ANAEROBIC Blood Culture results may not be optimal due to an inadequate volume of blood received in culture bottles   Culture   Final    NO GROWTH 5 DAYS Performed at [facility], [place] [address] [place], [place] [address]    Report Status 10/04/2018 FINAL  Final  Blood Culture (routine x 2)     Status: None   Collection Time: 09/29/18  9:40 PM    Specimen: BLOOD  Result Value Ref Range Status   Specimen Description BLOOD LEFT ANTECUBITAL  Final   Special Requests   Final    BOTTLES DRAWN AEROBIC ONLY Blood Culture results may not be optimal due to an excessive volume of blood received in culture bottles   Culture   Final    NO GROWTH 5 DAYS Performed at [facility], [place] [address] [place], [place] [address]    Report Status 10/04/2018 FINAL  Final  SARS Coronavirus 2     Status: Abnormal   Collection Time: 09/29/18  9:40 PM  Result Value Ref Range Status   SARS Coronavirus 2 DETECTED (A) NOT DETECTED Final    Comment: RESULT CALLED TO, READ BACK BY AND VERIFIED WITH: RN J LEBRON @2325  09/29/18 BY S GEZAHEGN (NOTE) SARS-CoV-2 target nucleic acids are DETECTED. The SARS-CoV-2 RNA is generally detectable in upper and lower respiratory specimens during the acute phase of infection. Positive results are indicative of active infection with SARS-CoV-2. Clinical  correlation with patient history and other diagnostic information is necessary to determine patient infection status. Positive results do  not rule out bacterial infection or co-infection with other viruses. The expected result is Not Detected. Fact Sheet for Patients: http://www.biofiredefense.com/wp-content/uploads/2020/03/BIOFIRE-COVID -19-patients.pdf Fact Sheet for Healthcare Providers: http://www.biofiredefense.com/wp-content/uploads/2020/03/BIOFIRE-COVID -19-hcp.pdf This test is not yet approved or cleared by the [place] and  has been authorized for detection and/or diagnosis of SARS-CoV-2 by FDA under an Patent examiner y Insurance account manager (EUA).  This EUA will remain in effect (meaning this test can be used) for the duration of  the COVID-19 declaration under Section 564(b)(1) of the Act, 21 U.S.C. section [phone] 3(b)(1), unless the authorization is terminated or revoked sooner. Performed at [facility], [place] [address], [place], [place]  [address]      Scheduled Meds: . enoxaparin (LOVENOX) injection  40 mg Subcutaneous Q24H  . methylPREDNISolone (SOLU-MEDROL) injection  40 mg Intravenous Q12H  . sodium chloride flush  3 mL Intravenous Once      LOS: 4 days   Cherene Altes, MD [facility] Office  [phone] Pager - Text Page per Shea Evans  If 7PM-7AM, please contact night-coverage per Amion 10/04/2018, 8:54 AM

## 2018-10-04 NOTE — Progress Notes (Signed)
SATURATION QUALIFICATIONS: (This note is used to comply with regulatory documentation for home oxygen)  Patient Saturations on Room Air at Rest = 86%  Patient Saturations on Room Air while Ambulating = 74%  Patient Saturations on 2 Liters of oxygen while Ambulating = 90%  Please briefly explain why patient needs home oxygen: Pt has significant desaturation while ambulating on RA.

## 2018-10-05 LAB — COMPREHENSIVE METABOLIC PANEL
ALT: 102 U/L — ABNORMAL HIGH (ref 0–44)
AST: 25 U/L (ref 15–41)
Albumin: 2.8 g/dL — ABNORMAL LOW (ref 3.5–5.0)
Alkaline Phosphatase: 112 U/L (ref 38–126)
Anion gap: 12 (ref 5–15)
BUN: 35 mg/dL — ABNORMAL HIGH (ref 8–23)
CO2: 26 mmol/L (ref 22–32)
Calcium: 8.7 mg/dL — ABNORMAL LOW (ref 8.9–10.3)
Chloride: 99 mmol/L (ref 98–111)
Creatinine, Ser: 0.76 mg/dL (ref 0.61–1.24)
GFR calc Af Amer: >60 mL/min (ref >60)
GFR calc non Af Amer: >60 mL/min (ref >60)
Glucose, Bld: 203 mg/dL — ABNORMAL HIGH (ref 70–99)
Potassium: 4.9 mmol/L (ref 3.5–5.1)
Sodium: 137 mmol/L (ref 135–145)
Total Bilirubin: 0.6 mg/dL (ref 0.3–1.2)
Total Protein: 5.8 g/dL — ABNORMAL LOW (ref 6.5–8.1)

## 2018-10-05 LAB — CBC WITH DIFFERENTIAL/PLATELET
Abs Immature Granulocytes: 0.13 10*3/uL — ABNORMAL HIGH (ref 0.00–0.07)
Basophils Absolute: 0 10*3/uL (ref 0.0–0.1)
Basophils Relative: 0 %
Eosinophils Absolute: 0.1 10*3/uL (ref 0.0–0.5)
Eosinophils Relative: 1 %
HCT: 46.7 % (ref 39.0–52.0)
Hemoglobin: 15.6 g/dL (ref 13.0–17.0)
Immature Granulocytes: 1 %
Lymphocytes Relative: 5 %
Lymphs Abs: 0.5 10*3/uL — ABNORMAL LOW (ref 0.7–4.0)
MCH: 31 pg (ref 26.0–34.0)
MCHC: 33.4 g/dL (ref 30.0–36.0)
MCV: 92.7 fL (ref 80.0–100.0)
Monocytes Absolute: 0.3 10*3/uL (ref 0.1–1.0)
Monocytes Relative: 2 %
Neutro Abs: 9.7 10*3/uL — ABNORMAL HIGH (ref 1.7–7.7)
Neutrophils Relative %: 91 %
Platelets: 420 10*3/uL — ABNORMAL HIGH (ref 150–400)
RBC: 5.04 MIL/uL (ref 4.22–5.81)
RDW: 13.2 % (ref 11.5–15.5)
WBC: 10.7 10*3/uL — ABNORMAL HIGH (ref 4.0–10.5)
nRBC: 0 % (ref 0.0–0.2)

## 2018-10-05 LAB — C-REACTIVE PROTEIN: CRP: 1 mg/dL — ABNORMAL HIGH (ref <1.0)

## 2018-10-05 LAB — FERRITIN: Ferritin: 534 ng/mL — ABNORMAL HIGH (ref 24–336)

## 2018-10-05 LAB — D-DIMER, QUANTITATIVE: D-Dimer, Quant: 0.83 ug/mL-FEU — ABNORMAL HIGH (ref 0.00–0.50)

## 2018-10-05 MED ORDER — PREDNISONE 10 MG PO TABS: ORAL_TABLET | ORAL | 0 refills | Status: AC

## 2018-10-05 MED ORDER — PREDNISONE 20 MG PO TABS
40.0000 mg | ORAL_TABLET | Freq: Two times a day (BID) | ORAL | Status: DC
  Administered 2018-10-05: 40 mg via ORAL
  Filled 2018-10-05: qty 2

## 2018-10-05 NOTE — Discharge Summary (Signed)
DISCHARGE SUMMARY  Charles Arias  MR#: [id number]  DOB:1955-09-21  Date of Admission: 09/29/2018 Date of Discharge: 10/05/2018  Attending Physician:Jeffrey Silvestre Gunner McClung, MD  Patient's WUJ:[id number]:Patient, No Pcp Per  Consults: none  Disposition: D/C home  Follow-up Appts: Case Manager to assist in acquiring a f/u appointment 5-7 days post D/C - info forthcoming and will be communicated to patient   Tests Needing Follow-up: -assess exercise tolerance and rule out hypoxia   Discharge Diagnoses: Acute Hypoxic Respiratory Failure COVID Pneumonia  Asthma Transaminitis   COVID-19 specific Treatment: Remdesivir 6/15 > 6/18 Steroids 6/15 > 6/24 Actemra 6/15   Initial presentation: [age]yo male with a hx of asthma who presented w/ a 4 day history of fever chills, body aches, shortness of breath, and loss of smell and taste.  Hospital Course:  Acute Hypoxic Respiratory Failure - COVID Pneumonia  Was dosed w/ Actemra and Remdesivir - to continue a steroid taper after d/c home - consented for convalescent plasma, but w/ significant clinical improvement and lack of available plasma infusion this was canceled - clinically improving at time of d/c - with placement of a sat probe on his forehead it was noted that his sats remained >88% even w/ ambulating 600'+ - this matches his clinical appearance as he is in no respiratory distress whatsoever    Asthma No wheezing at time of d/c   Elevated LFTs Stable/improving at time of d/c - improving off Remdesivir   Allergies as of 10/05/2018   No Known Allergies     Medication List    TAKE these medications   acetaminophen 500 MG tablet Commonly known as: TYLENOL Take 1,000 mg by mouth every 6 (six) hours as needed for fever or headache (pain).   albuterol 108 (90 Base) MCG/ACT inhaler Commonly known as: VENTOLIN HFA Inhale 2 puffs into the lungs every 6 (six) hours as needed for wheezing or shortness of breath.   predniSONE 10 MG tablet  Commonly known as: DELTASONE Take 2 tablets (20 mg total) by mouth 2 (two) times daily with a meal for 2 days, THEN 1 tablet (10 mg total) 2 (two) times daily with a meal for 2 days, THEN 1 tablet (10 mg total) daily with breakfast for 2 days. Start taking on: October 05, 2018       Day of Discharge BP 103/89 (BP Location: Left Arm)   Pulse 94   Temp 98.4 F (36.9 C) (Oral)   Resp 20   Ht 5' 2.99" (1.6 m)   Wt 70.3 kg   SpO2 96%   BMI 27.46 kg/m   Physical Exam: General: No acute respiratory distress Lungs: Clear to auscultation bilaterally without wheezes or crackles Cardiovascular: RRR w/o M or rub  Abdomen: Nontender, nondistended, soft, bowel sounds positive, no rebound, no ascites, no appreciable mass Extremities: No significant cyanosis, clubbing, or edema bilateral lower extremities  Basic Metabolic Panel: Recent Labs  Lab 10/01/18 0320 10/02/18 0243 10/03/18 0400 10/04/18 0300 10/05/18 0345  NA 139 137 139 136 137  K 4.4 4.5 4.7 4.7 4.9  CL 103 102 102 97* 99  CO2 25 25 26 27 26   GLUCOSE 195* 196* 185* 230* 203*  BUN 24* 26* 27* 29* 35*  CREATININE 0.71 0.75 0.76 0.77 0.76  CALCIUM 8.6* 8.6* 8.6* 8.6* 8.7*    Liver Function Tests: Recent Labs  Lab 10/01/18 0320 10/02/18 0243 10/03/18 0400 10/04/18 0300 10/05/18 0345  AST 78* 72* 75* 37 25  ALT 152* 149* 191* 147* 102*  ALKPHOS 113 123 126 126 112  BILITOT 0.5 0.5 0.4 0.4 0.6  PROT 6.2* 5.9* 5.7* 6.0* 5.8*  ALBUMIN 2.7* 2.7* 2.6* 2.8* 2.8*    CBC: Recent Labs  Lab 10/01/18 0320 10/02/18 0243 10/03/18 0400 10/04/18 0300 10/05/18 0345  WBC 8.2 11.0* 9.2 11.1* 10.7*  NEUTROABS 7.5 10.1* 8.4* 10.1* 9.7*  HGB 13.4 13.0 13.5 14.5 15.6  HCT 40.7 41.3 41.4 45.0 46.7  MCV 94.7 94.7 94.5 93.4 92.7  PLT 239 308 328 386 420*    Recent Results (from the past 240 hour(s))  Blood Culture (routine x 2)     Status: None   Collection Time: 09/29/18  9:16 PM   Specimen: BLOOD RIGHT HAND  Result Value  Ref Range Status   Specimen Description BLOOD RIGHT HAND  Final   Special Requests   Final    BOTTLES DRAWN AEROBIC AND ANAEROBIC Blood Culture results may not be optimal due to an inadequate volume of blood received in culture bottles   Culture   Final    NO GROWTH 5 DAYS Performed at [facility], [place] [address] [place], [place] [address]    Report Status 10/04/2018 FINAL  Final  Blood Culture (routine x 2)     Status: None   Collection Time: 09/29/18  9:40 PM   Specimen: BLOOD  Result Value Ref Range Status   Specimen Description BLOOD LEFT ANTECUBITAL  Final   Special Requests   Final    BOTTLES DRAWN AEROBIC ONLY Blood Culture results may not be optimal due to an excessive volume of blood received in culture bottles   Culture   Final    NO GROWTH 5 DAYS Performed at [facility], [place] [address], [place], [place] [address]    Report Status 10/04/2018 FINAL  Final  SARS Coronavirus 2     Status: Abnormal   Collection Time: 09/29/18  9:40 PM  Result Value Ref Range Status   SARS Coronavirus 2 DETECTED (A) NOT DETECTED Final    Comment: RESULT CALLED TO, READ BACK BY AND VERIFIED WITH: RN J LEBRON @2325  09/29/18 BY S GEZAHEGN (NOTE) SARS-CoV-2 target nucleic acids are DETECTED. The SARS-CoV-2 RNA is generally detectable in upper and lower respiratory specimens during the acute phase of infection. Positive results are indicative of active infection with SARS-CoV-2. Clinical  correlation with patient history and other diagnostic information is necessary to determine patient infection status. Positive results do  not rule out bacterial infection or co-infection with other viruses. The expected result is Not Detected. Fact Sheet for Patients: http://www.biofiredefense.com/wp-content/uploads/2020/03/BIOFIRE-COVID -19-patients.pdf Fact Sheet for Healthcare Providers: http://www.biofiredefense.com/wp-content/uploads/2020/03/BIOFIRE-COVID -19-hcp.pdf This test is not  yet approved or cleared by the [place] and  has been authorized for detection and/or diagnosis of SARS-CoV-2 by FDA under an Patent examiner y Insurance account manager (EUA).  This EUA will remain in effect (meaning this test can be used) for the duration of  the COVID-19 declaration under Section 564(b)(1) of the Act, 21 U.S.C. section [phone] 3(b)(1), unless the authorization is terminated or revoked sooner. Performed at [facility], [place] [address] [place], [place] [address]       Time spent in discharge (includes decision making & examination of pt): 35 minutes  10/05/2018, 10:47 AM   Cherene Altes, MD [facility] Office  [phone]

## 2018-10-05 NOTE — Discharge Instructions (Signed)
YOU MAY NOT RETURN TO WORK OR GO OUT IN PUBLIC UNTIL:  You have had no fever for at least 72 hours (that is three full days of no fever without the use of medicine that reduces fevers) AND other symptoms have improved (for example, when your cough or shortness of breath or diarrhea have improved) AND at least 10 days have passed since your symptoms first appeared       Person Under Monitoring Name: Charles Arias  Location: [place] [address]   Infection Prevention Recommendations for Individuals Confirmed to have, or Being Evaluated for, 2019 Novel Coronavirus (COVID-19) Infection Who Receive Care at Home  Individuals who are confirmed to have, or are being evaluated for, COVID-19 should follow the prevention steps below until a healthcare provider or local or state health department says they can return to normal activities.  Stay home except to get medical care You should restrict activities outside your home, except for getting medical care. Do not go to work, school, or public areas, and do not use public transportation or taxis.  Call ahead before visiting your doctor Before your medical appointment, call the healthcare provider and tell them that you have, or are being evaluated for, COVID-19 infection. This will help the healthcare providers office take steps to keep other people from getting infected. Ask your healthcare provider to call the local or state health department.  Monitor your symptoms Seek prompt medical attention if your illness is worsening (e.g., difficulty breathing). Before going to your medical appointment, call the healthcare provider and tell them that you have, or are being evaluated for, COVID-19 infection. Ask your healthcare provider to call the local or state health department.  Wear a facemask You should wear a facemask that covers your nose and mouth when you are in the same room with other people and when you visit a  healthcare provider. People who live with or visit you should also wear a facemask while they are in the same room with you.  Separate yourself from other people in your home As much as possible, you should stay in a different room from other people in your home. Also, you should use a separate bathroom, if available.  Avoid sharing household items You should not share dishes, drinking glasses, cups, eating utensils, towels, bedding, or other items with other people in your home. After using these items, you should wash them thoroughly with soap and water.  Cover your coughs and sneezes Cover your mouth and nose with a tissue when you cough or sneeze, or you can cough or sneeze into your sleeve. Throw used tissues in a lined trash can, and immediately wash your hands with soap and water for at least 20 seconds or use an alcohol-based hand rub.  Wash your [facility] your hands often and thoroughly with soap and water for at least 20 seconds. You can use an alcohol-based hand sanitizer if soap and water are not available and if your hands are not visibly dirty. Avoid touching your eyes, nose, and mouth with unwashed hands.   Prevention Steps for Caregivers and Household Members of Individuals Confirmed to have, or Being Evaluated for, COVID-19 Infection Being Cared for in the Home  If you live with, or provide care at home for, a person confirmed to have, or being evaluated for, COVID-19 infection please follow these guidelines to prevent infection:  Follow healthcare providers instructions Make sure that you understand and can help the patient follow  any healthcare provider instructions for all care.  Provide for the patients basic needs You should help the patient with basic needs in the home and provide support for getting groceries, prescriptions, and other personal needs.  Monitor the patients symptoms If they are getting sicker, call his or her medical provider and tell  them that the patient has, or is being evaluated for, COVID-19 infection. This will help the healthcare providers office take steps to keep other people from getting infected. Ask the healthcare provider to call the local or state health department.  Limit the number of people who have contact with the patient  If possible, have only one caregiver for the patient.  Other household members should stay in another home or place of residence. If this is not possible, they should stay  in another room, or be separated from the patient as much as possible. Use a separate bathroom, if available.  Restrict visitors who do not have an essential need to be in the home.  Keep older adults, very young children, and other sick people away from the patient Keep older adults, very young children, and those who have compromised immune systems or chronic health conditions away from the patient. This includes people with chronic heart, lung, or kidney conditions, diabetes, and cancer.  Ensure good ventilation Make sure that shared spaces in the home have good air flow, such as from an air conditioner or an opened window, weather permitting.  Wash your hands often  Wash your hands often and thoroughly with soap and water for at least 20 seconds. You can use an alcohol based hand sanitizer if soap and water are not available and if your hands are not visibly dirty.  Avoid touching your eyes, nose, and mouth with unwashed hands.  Use disposable paper towels to dry your hands. If not available, use dedicated cloth towels and replace them when they become wet.  Wear a facemask and gloves  Wear a disposable facemask at all times in the room and gloves when you touch or have contact with the patients blood, body fluids, and/or secretions or excretions, such as sweat, saliva, sputum, nasal mucus, vomit, urine, or feces.  Ensure the mask fits over your nose and mouth tightly, and do not touch it during  use.  Throw out disposable facemasks and gloves after using them. Do not reuse.  Wash your hands immediately after removing your facemask and gloves.  If your personal clothing becomes contaminated, carefully remove clothing and launder. Wash your hands after handling contaminated clothing.  Place all used disposable facemasks, gloves, and other waste in a lined container before disposing them with other household waste.  Remove gloves and wash your hands immediately after handling these items.  Do not share dishes, glasses, or other household items with the patient  Avoid sharing household items. You should not share dishes, drinking glasses, cups, eating utensils, towels, bedding, or other items with a patient who is confirmed to have, or being evaluated for, COVID-19 infection.  After the person uses these items, you should wash them thoroughly with soap and water.  Wash laundry thoroughly  Immediately remove and wash clothes or bedding that have blood, body fluids, and/or secretions or excretions, such as sweat, saliva, sputum, nasal mucus, vomit, urine, or feces, on them.  Wear gloves when handling laundry from the patient.  Read and follow directions on labels of laundry or clothing items and detergent. In general, wash and dry with the warmest temperatures recommended on  the label.  Clean all areas the individual has used often  Clean all touchable surfaces, such as counters, tabletops, doorknobs, bathroom fixtures, toilets, phones, keyboards, tablets, and bedside tables, every day. Also, clean any surfaces that may have blood, body fluids, and/or secretions or excretions on them.  Wear gloves when cleaning surfaces the patient has come in contact with.  Use a diluted bleach solution (e.g., dilute bleach with 1 part bleach and 10 parts water) or a household disinfectant with a label that says EPA-registered for coronaviruses. To make a bleach solution at home, add 1 tablespoon  of bleach to 1 quart (4 cups) of water. For a larger supply, add  cup of bleach to 1 gallon (16 cups) of water.  Read labels of cleaning products and follow recommendations provided on product labels. Labels contain instructions for safe and effective use of the cleaning product including precautions you should take when applying the product, such as wearing gloves or eye protection and making sure you have good ventilation during use of the product.  Remove gloves and wash hands immediately after cleaning.  Monitor yourself for signs and symptoms of illness Caregivers and household members are considered close contacts, should monitor their health, and will be asked to limit movement outside of the home to the extent possible. Follow the monitoring steps for close contacts listed on the symptom monitoring form.   ? If you have additional questions, contact your local health department or call the epidemiologist on call at [phone] (available 24/7). ? This guidance is subject to change. For the most up-to-date guidance from [facility], please refer to their website: [email or web address]

## 2018-10-15 ENCOUNTER — Other Ambulatory Visit: Payer: Self-pay

## 2018-10-15 ENCOUNTER — Emergency Department (HOSPITAL_COMMUNITY): Payer: Medicare Other

## 2018-10-15 ENCOUNTER — Encounter (HOSPITAL_COMMUNITY): Payer: Self-pay | Admitting: Emergency Medicine

## 2018-10-15 ENCOUNTER — Inpatient Hospital Stay (HOSPITAL_COMMUNITY)
Admission: EM | Admit: 2018-10-15 | Discharge: 2018-10-20 | DRG: 177 | Disposition: A | Discharge status: Home / Self Care | Payer: Medicare Other | Attending: Internal Medicine | Admitting: Internal Medicine

## 2018-10-15 DIAGNOSIS — U071 COVID-19: Secondary | ICD-10-CM

## 2018-10-15 DIAGNOSIS — J1282 Pneumonia due to coronavirus disease 2019: Secondary | ICD-10-CM

## 2018-10-15 DIAGNOSIS — I7410 Embolism and thrombosis of unspecified parts of aorta: Secondary | ICD-10-CM

## 2018-10-15 DIAGNOSIS — J1289 Other viral pneumonia: Secondary | ICD-10-CM | POA: Diagnosis present

## 2018-10-15 DIAGNOSIS — R0602 Shortness of breath: Secondary | ICD-10-CM | POA: Diagnosis not present

## 2018-10-15 DIAGNOSIS — Z79899 Other long term (current) drug therapy: Secondary | ICD-10-CM

## 2018-10-15 DIAGNOSIS — J45909 Unspecified asthma, uncomplicated: Secondary | ICD-10-CM | POA: Diagnosis present

## 2018-10-15 LAB — CBC WITH DIFFERENTIAL/PLATELET
Abs Immature Granulocytes: 0.05 10*3/uL (ref 0.00–0.07)
Basophils Absolute: 0.1 10*3/uL (ref 0.0–0.1)
Basophils Relative: 1 %
Eosinophils Absolute: 0.1 10*3/uL (ref 0.0–0.5)
Eosinophils Relative: 2 %
HCT: 42.9 % (ref 39.0–52.0)
Hemoglobin: 14.3 g/dL (ref 13.0–17.0)
Immature Granulocytes: 1 %
Lymphocytes Relative: 22 %
Lymphs Abs: 1.6 10*3/uL (ref 0.7–4.0)
MCH: 31.2 pg (ref 26.0–34.0)
MCHC: 33.3 g/dL (ref 30.0–36.0)
MCV: 93.5 fL (ref 80.0–100.0)
Monocytes Absolute: 1.1 10*3/uL — ABNORMAL HIGH (ref 0.1–1.0)
Monocytes Relative: 15 %
Neutro Abs: 4.4 10*3/uL (ref 1.7–7.7)
Neutrophils Relative %: 59 %
Platelets: 211 10*3/uL (ref 150–400)
RBC: 4.59 MIL/uL (ref 4.22–5.81)
RDW: 13.5 % (ref 11.5–15.5)
WBC: 7.3 10*3/uL (ref 4.0–10.5)
nRBC: 0 % (ref 0.0–0.2)

## 2018-10-15 LAB — COMPREHENSIVE METABOLIC PANEL
ALT: 35 U/L (ref 0–44)
AST: 27 U/L (ref 15–41)
Albumin: 3 g/dL — ABNORMAL LOW (ref 3.5–5.0)
Alkaline Phosphatase: 57 U/L (ref 38–126)
Anion gap: 7 (ref 5–15)
BUN: 10 mg/dL (ref 8–23)
CO2: 23 mmol/L (ref 22–32)
Calcium: 8.4 mg/dL — ABNORMAL LOW (ref 8.9–10.3)
Chloride: 105 mmol/L (ref 98–111)
Creatinine, Ser: 0.9 mg/dL (ref 0.61–1.24)
GFR calc Af Amer: >60 mL/min (ref >60)
GFR calc non Af Amer: >60 mL/min (ref >60)
Glucose, Bld: 107 mg/dL — ABNORMAL HIGH (ref 70–99)
Potassium: 4.6 mmol/L (ref 3.5–5.1)
Sodium: 135 mmol/L (ref 135–145)
Total Bilirubin: 1 mg/dL (ref 0.3–1.2)
Total Protein: 5.4 g/dL — ABNORMAL LOW (ref 6.5–8.1)

## 2018-10-15 MED ORDER — ALBUTEROL SULFATE HFA 108 (90 BASE) MCG/ACT IN AERS
2.0000 | INHALATION_SPRAY | RESPIRATORY_TRACT | Status: DC | PRN
  Administered 2018-10-15: 22:00:00 2 via RESPIRATORY_TRACT
  Filled 2018-10-15: qty 6.7

## 2018-10-15 MED ORDER — IOHEXOL 350 MG/ML SOLN
75.0000 mL | Freq: Once | INTRAVENOUS | Status: AC | PRN
  Administered 2018-10-15: 75 mL via INTRAVENOUS

## 2018-10-15 MED ORDER — HEPARIN BOLUS VIA INFUSION
5000.0000 [IU] | Freq: Once | INTRAVENOUS | Status: AC
  Administered 2018-10-15: 5000 [IU] via INTRAVENOUS
  Filled 2018-10-15: qty 5000

## 2018-10-15 MED ORDER — METHYLPREDNISOLONE SODIUM SUCC 125 MG IJ SOLR
125.0000 mg | Freq: Once | INTRAMUSCULAR | Status: AC
  Administered 2018-10-15: 125 mg via INTRAVENOUS
  Filled 2018-10-15: qty 2

## 2018-10-15 MED ORDER — HEPARIN (PORCINE) 25000 UT/250ML-% IV SOLN
1350.0000 [IU]/h | INTRAVENOUS | Status: DC
  Administered 2018-10-15 – 2018-10-17 (×3): 1250 [IU]/h via INTRAVENOUS
  Administered 2018-10-18: 1350 [IU]/h via INTRAVENOUS
  Filled 2018-10-15 (×4): qty 250

## 2018-10-15 NOTE — ED Triage Notes (Signed)
Pt tested positive for covid at the beginning of the month. Pt has been hospitalized for the virus and quarantined. Pt reports SOb that increased yesterday. Denies n/v/d. Complains of right upper back pain when he coughs.

## 2018-10-15 NOTE — ED Provider Notes (Signed)
[facility] Provider Note   CSN: [id number] Arrival date & time: 10/15/18  1638     History   Chief Complaint Chief Complaint  Patient presents with   Shortness of Breath    HPI Charles Arias is a 63 y.o. male.  He is a non-smoker with a prior history of asthma.  He was diagnosed with Covid and was hospitalized during the middle of this month.  He was discharged about a week ago and he said he was doing okay for a few days but for the last couple of days he has had increased shortness of breath worsening yesterday.  He had a follow-up with physicians regarding his Covid and they recommended he come back to the hospital for reevaluation.  He feels short of breath and has had a cough with some yellow sputum and also little bit of intermittent nosebleed from the right.  He is complaining of a little bit of right upper back pain worse with cough.  Been eating and drinking okay.     The history is provided by the patient. The history is limited by a language barrier. A language interpreter was used (ipad spanish).  Shortness of Breath Severity:  Moderate Onset quality:  Gradual Timing:  Constant Progression:  Worsening Chronicity:  Recurrent Context: activity   Relieved by:  Nothing Worsened by:  Activity Ineffective treatments:  None tried Associated symptoms: cough, sputum production and wheezing   Associated symptoms: no abdominal pain, no chest pain, no fever, no headaches, no hemoptysis, no rash, no sore throat and no vomiting   Risk factors: no tobacco use     Past Medical History:  Diagnosis Date   Asthma     Patient Active Problem List   Diagnosis Date Noted   Acute respiratory disease due to COVID-19 virus 09/30/2018    History reviewed. No pertinent surgical history.      Home Medications    Prior to Admission medications   Medication Sig Start Date End Date Taking? Authorizing Provider  acetaminophen (TYLENOL) 500  MG tablet Take 1,000 mg by mouth every 6 (six) hours as needed for fever or headache (pain).    [provider]  albuterol (VENTOLIN HFA) 108 (90 Base) MCG/ACT inhaler Inhale 2 puffs into the lungs every 6 (six) hours as needed for wheezing or shortness of breath.  05/08/18   [provider]    Family History History reviewed. No pertinent family history.  Social History Social History   Tobacco Use   Smoking status: Never Smoker   Smokeless tobacco: Never Used  Substance Use Topics   Alcohol use: Yes   Drug use: Never     Allergies   Patient has no known allergies.   Review of Systems Review of Systems  Constitutional: Negative for fever.  HENT: Negative for sore throat.   Eyes: Negative for visual disturbance.  Respiratory: Positive for cough, sputum production, shortness of breath and wheezing. Negative for hemoptysis.   Cardiovascular: Negative for chest pain.  Gastrointestinal: Negative for abdominal pain and vomiting.  Genitourinary: Negative for dysuria.  Musculoskeletal: Positive for back pain.  Skin: Negative for rash.  Neurological: Negative for headaches.     Physical Exam Updated Vital Signs BP 140/83 (BP Location: Right Arm)    Pulse 88    Temp 98.4 F (36.9 C) (Oral)    Resp 16    SpO2 96%   Physical Exam Vitals signs and nursing note reviewed.  Constitutional:  Appearance: He is well-developed.  HENT:     Head: Normocephalic and atraumatic.  Eyes:     Conjunctiva/sclera: Conjunctivae normal.  Neck:     Musculoskeletal: Neck supple.  Cardiovascular:     Rate and Rhythm: Normal rate and regular rhythm.     Heart sounds: No murmur.  Pulmonary:     Effort: Pulmonary effort is normal. Tachypnea present. No respiratory distress.     Breath sounds: Wheezing present.  Abdominal:     Palpations: Abdomen is soft.     Tenderness: There is no abdominal tenderness.  Musculoskeletal: Normal range of motion.     Right lower leg:  He exhibits no tenderness. No edema.     Left lower leg: He exhibits no tenderness. No edema.  Skin:    General: Skin is warm and dry.     Capillary Refill: Capillary refill takes less than 2 seconds.  Neurological:     General: No focal deficit present.     Mental Status: He is alert and oriented to person, place, and time.      ED Treatments / Results  Labs (all labs ordered are listed, but only abnormal results are displayed) Labs Reviewed  SARS CORONAVIRUS 2 (HOSPITAL ORDER, [facility]) - Abnormal; Notable for the following components:      Result Value   SARS Coronavirus 2 POSITIVE (*)    All other components within normal limits  COMPREHENSIVE METABOLIC PANEL - Abnormal; Notable for the following components:   Glucose, Bld 107 (*)    Calcium 8.4 (*)    Total Protein 5.4 (*)    Albumin 3.0 (*)    All other components within normal limits  CBC WITH DIFFERENTIAL/PLATELET - Abnormal; Notable for the following components:   Monocytes Absolute 1.1 (*)    All other components within normal limits  D-DIMER, QUANTITATIVE (NOT AT [facility]) - Abnormal; Notable for the following components:   D-Dimer, Quant 1.67 (*)    All other components within normal limits  HEPARIN LEVEL (UNFRACTIONATED)  C-REACTIVE PROTEIN  FERRITIN  FIBRINOGEN  LACTATE DEHYDROGENASE  PROCALCITONIN  TROPONIN I (HIGH SENSITIVITY)  TROPONIN I (HIGH SENSITIVITY)  HEPARIN LEVEL (UNFRACTIONATED)    EKG EKG Interpretation  Date/Time:  Tuesday October 15 2018 16:51:26 EDT Ventricular Rate:  103 PR Interval:  150 QRS Duration: 72 QT Interval:  340 QTC Calculation: 445 R Axis:   13 Text Interpretation:  Sinus tachycardia Cannot rule out Anterior infarct , age undetermined Abnormal ECG similar to prior 6/20 Confirmed by Aletta Edouard [phone]) on 10/15/2018 8:30:52 PM   Radiology Ct Angio Chest Pe W/cm &/or Wo Cm  Result Date: 10/15/2018 CLINICAL DATA:  63 year old male positive  COVID-19. Shortness of breath since yesterday. EXAM: CT ANGIOGRAPHY CHEST WITH CONTRAST TECHNIQUE: Multidetector CT imaging of the chest was performed using the standard protocol during bolus administration of intravenous contrast. Multiplanar CT image reconstructions and MIPs were obtained to evaluate the vascular anatomy. CONTRAST:  35mL OMNIPAQUE IOHEXOL 350 MG/ML SOLN COMPARISON:  Portable chest earlier today. FINDINGS: Cardiovascular: Good contrast bolus timing in the pulmonary arterial tree. No focal filling defect identified in the pulmonary arteries to suggest acute pulmonary embolism. However, there is a 16 millimeter area of adherent thrombus in the distal ascending aorta, proximal arch (series 6, image 89, series 5, image 31. The visible proximal great vessels appear to remain patent. Negative visible aorta otherwise. Cardiac size at the upper limits of normal. No pericardial effusion. Mediastinum/Nodes: Borderline to  mild reactive mediastinal and bilateral hilar lymph nodes. Lungs/Pleura: Widespread bilateral ground-glass and more confluent peribronchial opacity affecting all lobes. Areas in the superior segments of the lower lobes and posterior right upper lobe are bordering on consolidation. Major airways are patent. No pleural effusion. Upper Abdomen: Negative visible liver, gallbladder, spleen, pancreas, adrenal glands and bowel. Partially visible left renal cysts. Musculoskeletal: No acute osseous abnormality identified. Review of the MIP images confirms the above findings. IMPRESSION: 1. Positive for a 16 mm focus of adherent thrombus in the aorta (distal ascending/proximal arch - see series 5, image 31). The proximal great vessels appear to remain patent. 2. Negative for acute pulmonary embolus. 3. Widespread bilateral COVID-19 pneumonia.  No pleural effusion. 4. Negative visible upper abdomen. Study discussed by telephone with the ED provider on 10/15/2018 at 23:16 . Electronically Signed   By: Odessa FlemingH   Hall M.D.   On: 10/15/2018 23:22   Dg Chest Port 1 View  Result Date: 10/15/2018 CLINICAL DATA:  Covid19 positive EXAM: PORTABLE CHEST 1 VIEW COMPARISON:  09/29/2018 FINDINGS: Mild cardiac enlargement. Patchy bilateral mid and lower lung zone predominant interstitial and airspace opacities are again noted and appear increased from previous exam. IMPRESSION: 1. Interval progression of bilateral interstitial and airspace opacities. Electronically Signed   By: Signa Kellaylor  Stroud M.D.   On: 10/15/2018 20:42    Procedures .Critical Care Performed by: Terrilee FilesButler, Tanyiah Laurich C, MD Authorized by: Terrilee FilesButler, Alpha Mysliwiec C, MD   Critical care provider statement:    Critical care time (minutes):  45   Critical care time was exclusive of:  Separately billable procedures and treating other patients   Critical care was necessary to treat or prevent imminent or life-threatening deterioration of the following conditions:  Circulatory failure   Critical care was time spent personally by me on the following activities:  Discussions with consultants, evaluation of patient's response to treatment, examination of patient, ordering and performing treatments and interventions, ordering and review of laboratory studies, ordering and review of radiographic studies, pulse oximetry, re-evaluation of patient's condition, obtaining history from patient or surrogate, review of old charts and development of treatment plan with patient or surrogate   (including critical care time)  Medications Ordered in ED Medications  albuterol (VENTOLIN HFA) 108 (90 Base) MCG/ACT inhaler 2 puff (2 puffs Inhalation Given 10/15/18 2203)  heparin ADULT infusion 100 units/mL (25000 units/2650mL sodium chloride 0.45%) (1,250 Units/hr Intravenous New Bag/Given 10/15/18 2358)  acetaminophen (TYLENOL) tablet 650 mg (has no administration in time range)  guaiFENesin-dextromethorphan (ROBITUSSIN DM) 100-10 MG/5ML syrup 10 mL (has no administration in time range)    methylPREDNISolone sodium succinate (SOLU-MEDROL) 125 mg/2 mL injection 125 mg (125 mg Intravenous Given 10/15/18 2203)  iohexol (OMNIPAQUE) 350 MG/ML injection 75 mL (75 mLs Intravenous Contrast Given 10/15/18 2255)  heparin bolus via infusion 5,000 Units (5,000 Units Intravenous Bolus from Bag 10/15/18 2358)     Initial Impression / Assessment and Plan / ED Course  I have reviewed the triage vital signs and the nursing notes.  Pertinent labs & imaging results that were available during my care of the patient were reviewed by me and considered in my medical decision making (see chart for details).  Clinical Course as of Oct 16 907  Tue Oct 15, 2018  [id number] 63 year old male with recent Covid diagnosis here with increased shortness of breath and wheezing on exam.  His sats are okay.  Differential diagnosis includes asthma exacerbation, worsening Covid, pneumonia, PE   [MB]  2219 Due to  patient's worsening chest x-ray and his subjective increased shortness of breath we will proceed with CT NGO as he is at higher likelihood of thrombotic complications.  Clinically he is wheezing on exam so I have given him some steroids and an albuterol inhaler treatment.  Disposition per results of CAT scan testing and if no significant findings would need to do a trending pulse ox.   [MB]    Clinical Course User Index [MB] Terrilee FilesButler, Marsi Turvey C, MD   CT resulted with aortic thrombus. Signed out to Dr Oletta CohnPolina with plan to heparinize and admit.    Charles Arias was evaluated in Emergency Department on 10/15/2018 for the symptoms described in the history of present illness. He was evaluated in the context of the global COVID-19 pandemic, which necessitated consideration that the patient might be at risk for infection with the SARS-CoV-2 virus that causes COVID-19. Institutional protocols and algorithms that pertain to the evaluation of patients at risk for COVID-19 are in a state of rapid change based on information  released by regulatory bodies including the CDC and federal and state organizations. These policies and algorithms were followed during the patient's care in the ED.    Final Clinical Impressions(s) / ED Diagnoses   Final diagnoses:  SOB (shortness of breath)  Aortic thrombus (HCC)  2019 novel coronavirus disease (COVID-19)    ED Discharge Orders    None       Terrilee FilesButler, Quindarrius Joplin C, MD 10/16/18 [phone]

## 2018-10-15 NOTE — H&P (Signed)
History and Physical    Charles Arias NWG:[id number] DOB: 1955/11/15 DOA: 10/15/2018  PCP: Patient, No Pcp Per Patient coming from: Home  Chief Complaint: Shortness of breath  HPI: Charles Arias is a 63 y.o. male with medical history significant of asthma, recent admission to the hospital from September 29, 2018 to October 05, 2018 for COVID PNA presenting to the hospital for evaluation of worsening shortness of breath.  Spanish interpreter services used.  Patient states he was doing okay since his recent hospital discharge but started having a productive cough and shortness of breath yesterday.  Denies any chest pain.  Denies any fevers, chills, vomiting, abdominal pain, or diarrhea.  No other complaints.  ED Course: Afebrile.  Slightly tachycardic on arrival, heart rate now improved.  Slightly tachypneic.  Not hypotensive.  SPO2 92 to 100% on room air.  No leukocytosis or lymphopenia.  LFTs normal.  Chest x-ray showing interval progression of bilateral interstitial and airspace opacities.  CT angiogram negative for PE.  Showing widespread bilateral COVID-19 pneumonia.  In addition, positive for a 16 mm focus of adherent thrombus in the aorta (distal ascending/proximal arch).  The proximal great vessels appear to remain patent.  Patient was started on heparin infusion in the ED and received Solu-Medrol 125 mg.  Review of Systems:  All systems reviewed and apart from history of presenting illness, are negative.  Past Medical History:  Diagnosis Date  . Asthma     History reviewed. No pertinent surgical history.   reports that he has never smoked. He has never used smokeless tobacco. He reports current alcohol use. He reports that he does not use drugs.  No Known Allergies  History reviewed. No pertinent family history.  Prior to Admission medications   Medication Sig Start Date End Date Taking? Authorizing Provider  acetaminophen (TYLENOL) 500 MG tablet Take 1,000 mg by mouth every 6 (six) hours  as needed for fever or headache (pain).    [provider]  albuterol (VENTOLIN HFA) 108 (90 Base) MCG/ACT inhaler Inhale 2 puffs into the lungs every 6 (six) hours as needed for wheezing or shortness of breath.  05/08/18   [provider]    Physical Exam: Vitals:   10/16/18 0000 10/16/18 0015 10/16/18 0030 10/16/18 0045  BP: 132/83 114/66 121/84 123/81  Pulse: 95 97 98 98  Resp: (!) 24 20 (!) 26 (!) 24  Temp:      TempSrc:      SpO2: 93% 93% 92% 93%  Weight:      Height:        Physical Exam  Constitutional: He is oriented to person, place, and time. He appears well-developed and well-nourished. No distress.  HENT:  Head: Normocephalic.  Eyes: Right eye exhibits no discharge. Left eye exhibits no discharge.  Neck: Neck supple.  Cardiovascular: Normal rate, regular rhythm and intact distal pulses.  Pulmonary/Chest: Effort normal and breath sounds normal. No respiratory distress. He has no wheezes. He has no rales.  Abdominal: Soft. Bowel sounds are normal. He exhibits no distension. There is no abdominal tenderness. There is no guarding.  Musculoskeletal:        General: No edema.  Neurological: He is alert and oriented to person, place, and time.  Skin: Skin is warm and dry. He is not diaphoretic.     Labs on Admission: I have personally reviewed following labs and imaging studies  CBC: Recent Labs  Lab 10/15/18 2030  WBC 7.3  NEUTROABS 4.4  HGB 14.3  HCT 42.9  MCV 93.5  PLT 035   Basic Metabolic Panel: Recent Labs  Lab 10/15/18 2030  NA 135  K 4.6  CL 105  CO2 23  GLUCOSE 107*  BUN 10  CREATININE 0.90  CALCIUM 8.4*   GFR: Estimated Creatinine Clearance: 72.1 mL/min (by C-G formula based on SCr of 0.9 mg/dL). Liver Function Tests: Recent Labs  Lab 10/15/18 2030  AST 27  ALT 35  ALKPHOS 57  BILITOT 1.0  PROT 5.4*  ALBUMIN 3.0*   No results for input(s): LIPASE, AMYLASE in the last 168 hours. No results for input(s): AMMONIA in  the last 168 hours. Coagulation Profile: No results for input(s): INR, PROTIME in the last 168 hours. Cardiac Enzymes: No results for input(s): CKTOTAL, CKMB, CKMBINDEX, TROPONINI in the last 168 hours. BNP (last 3 results) No results for input(s): PROBNP in the last 8760 hours. HbA1C: No results for input(s): HGBA1C in the last 72 hours. CBG: No results for input(s): GLUCAP in the last 168 hours. Lipid Profile: No results for input(s): CHOL, HDL, LDLCALC, TRIG, CHOLHDL, LDLDIRECT in the last 72 hours. Thyroid Function Tests: No results for input(s): TSH, T4TOTAL, FREET4, T3FREE, THYROIDAB in the last 72 hours. Anemia Panel: No results for input(s): VITAMINB12, FOLATE, FERRITIN, TIBC, IRON, RETICCTPCT in the last 72 hours. Urine analysis:    Component Value Date/Time   COLORURINE YELLOW 09/30/2018 0239   APPEARANCEUR CLEAR 09/30/2018 0239   LABSPEC 1.016 09/30/2018 0239   PHURINE 6.0 09/30/2018 0239   GLUCOSEU NEGATIVE 09/30/2018 0239   HGBUR NEGATIVE 09/30/2018 0239   BILIRUBINUR NEGATIVE 09/30/2018 0239   KETONESUR NEGATIVE 09/30/2018 0239   PROTEINUR 30 (A) 09/30/2018 0239   NITRITE NEGATIVE 09/30/2018 0239   LEUKOCYTESUR NEGATIVE 09/30/2018 0239    Radiological Exams on Admission: Ct Angio Chest Pe W/cm &/or Wo Cm  Result Date: 10/15/2018 CLINICAL DATA:  63 year old male positive COVID-19. Shortness of breath since yesterday. EXAM: CT ANGIOGRAPHY CHEST WITH CONTRAST TECHNIQUE: Multidetector CT imaging of the chest was performed using the standard protocol during bolus administration of intravenous contrast. Multiplanar CT image reconstructions and MIPs were obtained to evaluate the vascular anatomy. CONTRAST:  29m OMNIPAQUE IOHEXOL 350 MG/ML SOLN COMPARISON:  Portable chest earlier today. FINDINGS: Cardiovascular: Good contrast bolus timing in the pulmonary arterial tree. No focal filling defect identified in the pulmonary arteries to suggest acute pulmonary embolism.  However, there is a 16 millimeter area of adherent thrombus in the distal ascending aorta, proximal arch (series 6, image 89, series 5, image 31. The visible proximal great vessels appear to remain patent. Negative visible aorta otherwise. Cardiac size at the upper limits of normal. No pericardial effusion. Mediastinum/Nodes: Borderline to mild reactive mediastinal and bilateral hilar lymph nodes. Lungs/Pleura: Widespread bilateral ground-glass and more confluent peribronchial opacity affecting all lobes. Areas in the superior segments of the lower lobes and posterior right upper lobe are bordering on consolidation. Major airways are patent. No pleural effusion. Upper Abdomen: Negative visible liver, gallbladder, spleen, pancreas, adrenal glands and bowel. Partially visible left renal cysts. Musculoskeletal: No acute osseous abnormality identified. Review of the MIP images confirms the above findings. IMPRESSION: 1. Positive for a 16 mm focus of adherent thrombus in the aorta (distal ascending/proximal arch - see series 5, image 31). The proximal great vessels appear to remain patent. 2. Negative for acute pulmonary embolus. 3. Widespread bilateral COVID-19 pneumonia.  No pleural effusion. 4. Negative visible upper abdomen. Study discussed by telephone with the ED provider on 10/15/2018  at 23:16 . Electronically Signed   By: Genevie Ann M.D.   On: 10/15/2018 23:22   Dg Chest Port 1 View  Result Date: 10/15/2018 CLINICAL DATA:  Covid19 positive EXAM: PORTABLE CHEST 1 VIEW COMPARISON:  09/29/2018 FINDINGS: Mild cardiac enlargement. Patchy bilateral mid and lower lung zone predominant interstitial and airspace opacities are again noted and appear increased from previous exam. IMPRESSION: 1. Interval progression of bilateral interstitial and airspace opacities. Electronically Signed   By: Kerby Moors M.D.   On: 10/15/2018 20:42    EKG: Independently reviewed.  Sinus tachycardia (heart rate 103), no significant  change since prior tracing.  Assessment/Plan Principal Problem:   Pneumonia due to COVID-19 virus Active Problems:   Aortic thrombus (HCC)   Asthma   Suspected COVID-19 viral pneumonia Afebrile.  Slightly tachycardic on arrival, heart rate now improved.  Slightly tachypneic.  Not hypotensive.  Currently satting around 95% on room air.  No leukocytosis or lymphopenia.  LFTs normal.  CT showing widespread bilateral COVID-19 pneumonia.  Patient was recently admitted to [facility] for COVID-19 viral pneumonia. -Received Solumedrol 125 mg in the ED -Will need Remdesivir if SpO2 <94% -Do not give NSAIDs -Guaifenesin DM as needed for cough -Check ferritin, fibrinogen, d-dimer, LDH, ESR, CRP, procalcitonin, troponin -Airborne and contact precautions -Continuous pulse ox -Supplemental oxygen if needed -Repeat SARS-CoV-2 test pending  Aortic thrombus Suspect secondary to hypercoagulable state from COVID-19 viral infection.  CT angiogram positive for a 16 mm focus of adherent thrombus in the aorta (distal ascending/proximal arch).  The proximal great vessels appear to remain patent. -Continue IV heparin -Echocardiogram to rule out intracardiac source of thrombus -Consult vascular surgery in a.m. -Discussed case with on-call physician at [place].  Plan is to observe the patient here at [facility] for the next 24 hours given aortic thrombus on imaging. If he remains stable for the next 24 hours, consider transfer to [facility].  Asthma -Stable.  No bronchospasm.  Albuterol inhaler as needed.  DVT prophylaxis: Heparin Code Status: Full code Family Communication: No family available at this time. Disposition Plan: Anticipate discharge after clinical improvement. Consults called: None Admission status: It is my clinical opinion that admission to INPATIENT is reasonable and necessary in this 63 y.o. male . presenting with signs and symptoms consistent with COVID-19 viral pneumonia and aortic thrombus.  He will  need IV anticoagulation for at least 24 hours and further work-up in the morning.  Given the aforementioned, the predictability of an adverse outcome is felt to be significant. I expect that the patient will require at least 2 midnights in the hospital to treat this condition.   The medical decision making on this patient was of high complexity and the patient is at high risk for clinical deterioration, therefore this is a level 3 visit.  Shela Leff MD [facility] Pager [phone]  If 7PM-7AM, please contact night-coverage [email or web address] Password [email or web address]  10/16/2018, 2:20 AM

## 2018-10-15 NOTE — ED Notes (Addendum)
Call Daughter [place] with update. [phone]

## 2018-10-16 ENCOUNTER — Inpatient Hospital Stay (HOSPITAL_COMMUNITY): Payer: Medicare Other

## 2018-10-16 DIAGNOSIS — I7410 Embolism and thrombosis of unspecified parts of aorta: Secondary | ICD-10-CM | POA: Diagnosis not present

## 2018-10-16 DIAGNOSIS — U071 COVID-19: Secondary | ICD-10-CM | POA: Diagnosis present

## 2018-10-16 DIAGNOSIS — J45909 Unspecified asthma, uncomplicated: Secondary | ICD-10-CM | POA: Diagnosis present

## 2018-10-16 DIAGNOSIS — R0602 Shortness of breath: Secondary | ICD-10-CM

## 2018-10-16 DIAGNOSIS — Z79899 Other long term (current) drug therapy: Secondary | ICD-10-CM | POA: Diagnosis not present

## 2018-10-16 DIAGNOSIS — J1289 Other viral pneumonia: Secondary | ICD-10-CM | POA: Diagnosis present

## 2018-10-16 LAB — ECHOCARDIOGRAM LIMITED
Height: 63 in
Weight: 2480 oz

## 2018-10-16 LAB — HEPARIN LEVEL (UNFRACTIONATED)
Heparin Unfractionated: 0.49 IU/mL (ref 0.30–0.70)
Heparin Unfractionated: 0.59 IU/mL (ref 0.30–0.70)

## 2018-10-16 LAB — SARS CORONAVIRUS 2 BY RT PCR (HOSPITAL ORDER, PERFORMED IN ~~LOC~~ HOSPITAL LAB): SARS Coronavirus 2: POSITIVE — AB

## 2018-10-16 LAB — ANTITHROMBIN III: AntiThromb III Func: 93 % (ref 75–120)

## 2018-10-16 LAB — C-REACTIVE PROTEIN: CRP: <0.8 mg/dL (ref <1.0)

## 2018-10-16 LAB — LACTATE DEHYDROGENASE: LDH: 190 U/L (ref 98–192)

## 2018-10-16 LAB — FIBRINOGEN: Fibrinogen: 379 mg/dL (ref 210–475)

## 2018-10-16 LAB — TROPONIN I (HIGH SENSITIVITY)
Troponin I (High Sensitivity): 4 ng/L (ref <18)
Troponin I (High Sensitivity): 4 ng/L (ref <18)

## 2018-10-16 LAB — PROCALCITONIN: Procalcitonin: <0.1 ng/mL

## 2018-10-16 LAB — FERRITIN: Ferritin: 301 ng/mL (ref 24–336)

## 2018-10-16 LAB — D-DIMER, QUANTITATIVE: D-Dimer, Quant: 1.67 ug/mL-FEU — ABNORMAL HIGH (ref 0.00–0.50)

## 2018-10-16 MED ORDER — SODIUM CHLORIDE 0.9 % IV SOLN
500.0000 mg | INTRAVENOUS | Status: DC
  Administered 2018-10-16 – 2018-10-19 (×4): 500 mg via INTRAVENOUS
  Filled 2018-10-16 (×5): qty 500

## 2018-10-16 MED ORDER — GUAIFENESIN-DM 100-10 MG/5ML PO SYRP: 10.0000 mL | ORAL_SOLUTION | ORAL | Status: DC | PRN

## 2018-10-16 MED ORDER — ACETAMINOPHEN 325 MG PO TABS: 650.0000 mg | ORAL_TABLET | Freq: Four times a day (QID) | ORAL | Status: DC | PRN

## 2018-10-16 MED ORDER — METHYLPREDNISOLONE SODIUM SUCC 125 MG IJ SOLR
60.0000 mg | INTRAMUSCULAR | Status: DC
  Administered 2018-10-16 – 2018-10-18 (×3): 60 mg via INTRAVENOUS
  Filled 2018-10-16 (×3): qty 2

## 2018-10-16 NOTE — Progress Notes (Signed)
ANTICOAGULATION CONSULT NOTE - Initial Consult  Pharmacy Consult for heparin Indication: thrombus in ascending aorta  No Known Allergies  Patient Measurements: Height: 5\' 3"  (160 cm) Weight: 155 lb (70.3 kg) IBW/kg (Calculated) : 56.9 Heparin Dosing Weight: 70 kg  Vital Signs: Temp: 98.4 F (36.9 C) (06/30 1650) Temp Source: Oral (06/30 1650) BP: 112/79 (06/30 2345) Pulse Rate: 90 (06/30 2345)  Labs: Recent Labs    10/15/18 2030  HGB 14.3  HCT 42.9  PLT 211  CREATININE 0.90    Estimated Creatinine Clearance: 72.1 mL/min (by C-G formula based on SCr of 0.9 mg/dL).   Medical History: Past Medical History:  Diagnosis Date  . Asthma     Medications:  See medication history  Assessment: 63 yo man with recent dx COVID-19 presents with SOB.  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  Plan to start heparin. Goal of Therapy:  Heparin level 0.3-0.7 units/ml Monitor platelets by anticoagulation protocol: Yes   Plan:  Heparin bolus 5000 unit bolus and start drip at 1250 units/hr Check heparin level 6-8 hours after start and daily while on heparin Daily CBC.  Monitor for bleeding complications.  Thanks for allowing pharmacy to be a part of this patient's care.  Excell Seltzer, PharmD Clinical Pharmacist 10/16/2018,12:22 AM

## 2018-10-16 NOTE — Progress Notes (Signed)
PROGRESS NOTE    Charles Arias  UYQ:[id number] DOB: 05-10-1955 DOA: 10/15/2018 PCP: Patient, No Pcp Per    Brief Narrative:  Charles Arias is a 63 y.o. male with medical history significant of asthma, recent admission to the hospital from September 29, 2018 to October 05, 2018 for COVID PNA presenting to the hospital for evaluation of worsening shortness of breath.  Chest x-ray showing interval progression of bilateral interstitial and airspace opacities.  CT angiogram negative for PE.  Showing widespread bilateral COVID-19 pneumonia.  In addition, positive for a 16 mm focus of adherent thrombus in the aorta (distal ascending/proximal arch).  The proximal great vessels appear to remain patent.  Patient was started on heparin infusion in the ED and received Solu-Medrol 125 mg.  Assessment & Plan:   Principal Problem:   Pneumonia due to COVID-19 virus Active Problems:   Aortic thrombus (HCC)   Asthma   covid 19 viral pneumonia:  Ct CHEST SHOWING widespread bil pneumonia.  Good sats on RA.  Continue with airborne and contact precautions, oxygen as needed to keep sats greater than 90%.   Incidental finding of aortic thrombus on CT angiogram of the chest Patient was started on IV heparin.  Echocardiogram was ordered. Cardiothoracic surgeon consulted and recommendations given.   In view of the active viral infection, no surgical intervention planned this admission. Hypercoagulable work up ordered.      Asthma No wheezing heard Bronchodilators as needed.   DVT prophylaxis: Subcu heparin. Code Status: Full code Family Communication: None at bedside Disposition Plan: Pending clinical improvement   Consultants:   Cardiothoracic surgery   Procedures: (Echo  Antimicrobials: azithromycin   Subjective: No new complaints.   Objective: Vitals:   10/16/18 0930 10/16/18 1000 10/16/18 1015 10/16/18 1051  BP: 113/83 102/81 108/75 (!) 146/99  Pulse: (!) 110 (!) 108 (!) 109 (!) 111   Resp: 19 (!) 21 19 20   Temp:    98.6 F (37 C)  TempSrc:    Oral  SpO2: 93% 92% 93% 95%  Weight:      Height:        Intake/Output Summary (Last 24 hours) at 10/16/2018 1808 Last data filed at 10/16/2018 1500 Gross per 24 hour  Intake 237.13 ml  Output 700 ml  Net -462.87 ml   Filed Weights   10/15/18 2300  Weight: 70.3 kg    Examination:  General exam: Appears calm and comfortable  Respiratory system: Clear to auscultation. Respiratory effort normal. Cardiovascular system: S1 & S2 heard, RRR. No JVD,  Gastrointestinal system: Abdomen is nondistended, soft and nontender Central nervous system: Alert and oriented. No focal neurological deficits. Extremities: Symmetric 5 x 5 power. Skin: No rashes, lesions or ulcers Psychiatry:  Mood & affect appropriate.     Data Reviewed: I have personally reviewed following labs and imaging studies  CBC: Recent Labs  Lab 10/15/18 2030  WBC 7.3  NEUTROABS 4.4  HGB 14.3  HCT 42.9  MCV 93.5  PLT 638   Basic Metabolic Panel: Recent Labs  Lab 10/15/18 2030  NA 135  K 4.6  CL 105  CO2 23  GLUCOSE 107*  BUN 10  CREATININE 0.90  CALCIUM 8.4*   GFR: Estimated Creatinine Clearance: 72.1 mL/min (by C-G formula based on SCr of 0.9 mg/dL). Liver Function Tests: Recent Labs  Lab 10/15/18 2030  AST 27  ALT 35  ALKPHOS 57  BILITOT 1.0  PROT 5.4*  ALBUMIN 3.0*   No results for input(s): LIPASE, AMYLASE  in the last 168 hours. No results for input(s): AMMONIA in the last 168 hours. Coagulation Profile: No results for input(s): INR, PROTIME in the last 168 hours. Cardiac Enzymes: No results for input(s): CKTOTAL, CKMB, CKMBINDEX, TROPONINI in the last 168 hours. BNP (last 3 results) No results for input(s): PROBNP in the last 8760 hours. HbA1C: No results for input(s): HGBA1C in the last 72 hours. CBG: No results for input(s): GLUCAP in the last 168 hours. Lipid Profile: No results for input(s): CHOL, HDL, LDLCALC,  TRIG, CHOLHDL, LDLDIRECT in the last 72 hours. Thyroid Function Tests: No results for input(s): TSH, T4TOTAL, FREET4, T3FREE, THYROIDAB in the last 72 hours. Anemia Panel: Recent Labs    10/16/18 0248  FERRITIN 301   Sepsis Labs: Recent Labs  Lab 10/16/18 0248  PROCALCITON <0.10    Recent Results (from the past 240 hour(s))  SARS Coronavirus 2 (CEPHEID- Performed in [facility] hospital lab), Hosp Order     Status: Abnormal   Collection Time: 10/16/18 12:24 AM   Specimen: Nasopharyngeal Swab  Result Value Ref Range Status   SARS Coronavirus 2 POSITIVE (A) NEGATIVE Final    Comment: RESULT CALLED TO, READ BACK BY AND VERIFIED WITH: RN T JORDEN @ 10/16/18 BY S GEZAHEGN (NOTE) If result is NEGATIVE SARS-CoV-2 target nucleic acids are NOT DETECTED. The SARS-CoV-2 RNA is generally detectable in upper and lower  respiratory specimens during the acute phase of infection. The lowest  concentration of SARS-CoV-2 viral copies this assay can detect is 250  copies / mL. A negative result does not preclude SARS-CoV-2 infection  and should not be used as the sole basis for treatment or other  patient management decisions.  A negative result may occur with  improper specimen collection / handling, submission of specimen other  than nasopharyngeal swab, presence of viral mutation(s) within the  areas targeted by this assay, and inadequate number of viral copies  (<250 copies / mL). A negative result must be combined with clinical  observations, patient history, and epidemiological information. If result is POSITIVE SARS-CoV-2 target nucleic acids are DETECTED. Th e SARS-CoV-2 RNA is generally detectable in upper and lower  respiratory specimens during the acute phase of infection.  Positive  results are indicative of active infection with SARS-CoV-2.  Clinical  correlation with patient history and other diagnostic information is  necessary to determine patient infection status.  Positive  results do  not rule out bacterial infection or co-infection with other viruses. If result is PRESUMPTIVE POSTIVE SARS-CoV-2 nucleic acids MAY BE PRESENT.   A presumptive positive result was obtained on the submitted specimen  and confirmed on repeat testing.  While 2019 novel coronavirus  (SARS-CoV-2) nucleic acids may be present in the submitted sample  additional confirmatory testing may be necessary for epidemiological  and / or clinical management purposes  to differentiate between  SARS-CoV-2 and other Sarbecovirus currently known to infect humans.  If clinically indicated additional testing with an alternate test  methodology [phone]([id number]) is  advised. The SARS-CoV-2 RNA is generally  detectable in upper and lower respiratory specimens during the acute  phase of infection. The expected result is Negative. Fact Sheet for Patients:  BoilerBrush.com.cyhttps://www.fda.gov/media/136312/download Fact Sheet for Healthcare Providers: https://pope.com/https://www.fda.gov/media/136313/download This test is not yet approved or cleared by the [place] FDA and has been authorized for detection and/or diagnosis of SARS-CoV-2 by FDA under an Emergency Use Authorization (EUA).  This EUA will remain in effect (meaning this test can be used) for the  duration of the COVID-19 declaration under Section 564(b)(1) of the Act, 21 U.S.C. section 360bbb-3(b)(1), unless the authorization is terminated or revoked sooner. Performed at [facility], [address] [place], [place] [address]          Radiology Studies: Ct Angio Chest Pe W/cm &/or Wo Cm  Result Date: 10/15/2018 CLINICAL DATA:  63 year old male positive COVID-19. Shortness of breath since yesterday. EXAM: CT ANGIOGRAPHY CHEST WITH CONTRAST TECHNIQUE: Multidetector CT imaging of the chest was performed using the standard protocol during bolus administration of intravenous contrast. Multiplanar CT image reconstructions and MIPs were obtained to evaluate the vascular  anatomy. CONTRAST:  75mL OMNIPAQUE IOHEXOL 350 MG/ML SOLN COMPARISON:  Portable chest earlier today. FINDINGS: Cardiovascular: Good contrast bolus timing in the pulmonary arterial tree. No focal filling defect identified in the pulmonary arteries to suggest acute pulmonary embolism. However, there is a 16 millimeter area of adherent thrombus in the distal ascending aorta, proximal arch (series 6, image 89, series 5, image 31. The visible proximal great vessels appear to remain patent. Negative visible aorta otherwise. Cardiac size at the upper limits of normal. No pericardial effusion. Mediastinum/Nodes: Borderline to mild reactive mediastinal and bilateral hilar lymph nodes. Lungs/Pleura: Widespread bilateral ground-glass and more confluent peribronchial opacity affecting all lobes. Areas in the superior segments of the lower lobes and posterior right upper lobe are bordering on consolidation. Major airways are patent. No pleural effusion. Upper Abdomen: Negative visible liver, gallbladder, spleen, pancreas, adrenal glands and bowel. Partially visible left renal cysts. Musculoskeletal: No acute osseous abnormality identified. Review of the MIP images confirms the above findings. IMPRESSION: 1. Positive for a 16 mm focus of adherent thrombus in the aorta (distal ascending/proximal arch - see series 5, image 31). The proximal great vessels appear to remain patent. 2. Negative for acute pulmonary embolus. 3. Widespread bilateral COVID-19 pneumonia.  No pleural effusion. 4. Negative visible upper abdomen. Study discussed by telephone with the ED provider on 10/15/2018 at 23:16 . Electronically Signed   By: Odessa FlemingH  Hall M.D.   On: 10/15/2018 23:22   Dg Chest Port 1 View  Result Date: 10/15/2018 CLINICAL DATA:  Covid19 positive EXAM: PORTABLE CHEST 1 VIEW COMPARISON:  09/29/2018 FINDINGS: Mild cardiac enlargement. Patchy bilateral mid and lower lung zone predominant interstitial and airspace opacities are again noted and  appear increased from previous exam. IMPRESSION: 1. Interval progression of bilateral interstitial and airspace opacities. Electronically Signed   By: Signa Kellaylor  Stroud M.D.   On: 10/15/2018 20:42        Scheduled Meds: Continuous Infusions: . heparin 1,250 Units/hr (10/16/18 1617)     LOS: 0 days    Time spent: 40 minutes.     Kathlen ModyVijaya Jaylei Fuerte, MD [facility] Pager [phone]  If 7PM-7AM, please contact night-coverage [email or web address] Password [email or web address] 10/16/2018, 6:08 PM

## 2018-10-16 NOTE — Progress Notes (Signed)
[place] for heparin Indication: thrombus in ascending aorta  No Known Allergies  Patient Measurements: Height: 5\' 3"  (160 cm) Weight: 155 lb (70.3 kg) IBW/kg (Calculated) : 56.9 Heparin Dosing Weight: 70 kg  Vital Signs: BP: 132/87 (07/01 0249) Pulse Rate: 123 (07/01 0249)  Labs: Recent Labs    10/15/18 2030 10/16/18 0248 10/16/18 0336 10/16/18 0554  HGB 14.3  --   --   --   HCT 42.9  --   --   --   PLT 211  --   --   --   HEPARINUNFRC  --   --   --  0.49  CREATININE 0.90  --   --   --   TROPONINIHS  --  4 4  --     Estimated Creatinine Clearance: 72.1 mL/min (by C-G formula based on SCr of 0.9 mg/dL).   Medical History: Past Medical History:  Diagnosis Date  . Asthma     Assessment: 63 yo man with recent dx COVID-19 presents with SOB. 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 Plan to start heparin and consult Vascular Surgery. Initial heparin level therapeutic at 0.49. Not on anticoagulation PTA. CBC WNL. No active bleed issues documented.  Goal of Therapy:  Heparin level 0.3-0.7 units/ml Monitor platelets by anticoagulation protocol: Yes   Plan:  Continue heparin drip at 1250 units/hr 6h heparin level to confirm Monitor daily heparin level and CBC, s/sx bleeding F/u Vascular Surgery plans  Elicia Lamp, PharmD, BCPS Please check AMION for all [place] contact numbers Clinical Pharmacist 10/16/2018 7:45 AM

## 2018-10-16 NOTE — Consult Note (Signed)
[address]       [place] [id number]             [phone]        Charles MassingJavier Arias [facility] Medical Record #[id number] Date of Birth: 1956/01/17  Referring: No ref. provider found Primary Care: Patient, No Pcp Per Primary Cardiologist:No primary care provider on file.  Chief Complaint:    Chief Complaint  Patient presents with  . Shortness of Breath  This consult is completed as a virtual consultation due to the active presence of COVID and objective to reduce contamination within the hospital system; I have personally reviewed all pertinent materials and images which are used to form the basis for this medical opinion.   History of Present Illness:      63 yo male recently treated for COVID19 as outpatient until yesterday when sx worsened, prompting [facility]. CXR noted worsened leading to CT scan, which showed 1.6 cm thrombus in distal aorta. The aorta is otherwise mildly diseased (mid-ascending 4.4 cm in diameter). Admitted for management of lung disease, started on heparin gtt for aortic thrombus. No history of stroke  Current Activity/ Functional Status: Patient is independent with mobility/ambulation, transfers, ADL's, IADL's.   Zubrod Score: At the time of surgery this patient's most appropriate activity status/level should be described as: [x]     0    Normal activity, no symptoms []     1    Restricted in physical strenuous activity but ambulatory, able to do out light work []     2    Ambulatory and capable of self care, unable to do work activities, up and about                 more than 50%  Of the time                            []     3    Only limited self care, in bed greater than 50% of waking hours []     4    Completely disabled, no self care, confined to bed or chair []     5    Moribund  Past Medical History:  Diagnosis Date  . Asthma     History reviewed. No pertinent surgical history.  Social History    Tobacco Use  Smoking Status Never Smoker  Smokeless Tobacco Never Used    Social History   Substance and Sexual Activity  Alcohol Use Yes     No Known Allergies  Current Facility-Administered Medications  Medication Dose Route Frequency Provider Last Rate Last Dose  . acetaminophen (TYLENOL) tablet 650 mg  650 mg Oral Q6H PRN John Giovanniathore, Vasundhra, MD      . albuterol (VENTOLIN HFA) 108 (90 Base) MCG/ACT inhaler 2 puff  2 puff Inhalation Q4H PRN John Giovanniathore, Vasundhra, MD   2 puff at 10/15/18 2203  . guaiFENesin-dextromethorphan (ROBITUSSIN DM) 100-10 MG/5ML syrup 10 mL  10 mL Oral Q4H PRN John Giovanniathore, Vasundhra, MD      . heparin ADULT infusion 100 units/mL (25000 units/25650mL sodium chloride 0.45%)  1,250 Units/hr Intravenous Continuous John Giovanniathore, Vasundhra, MD 12.5 mL/hr at 10/15/18 2358 1,250 Units/hr at 10/15/18 2358    Medications Prior to Admission  Medication Sig Dispense Refill Last Dose  . acetaminophen (TYLENOL) 500 MG tablet Take 1,000 mg by mouth every 6 (six) hours as needed for fever or headache (pain).  10/15/2018 at Unknown time  . albuterol (VENTOLIN HFA) 108 (90 Base) MCG/ACT inhaler Inhale 2 puffs into the lungs every 6 (six) hours as needed for wheezing or shortness of breath.    10/15/2018 at Unknown time  . benzonatate (TESSALON) 100 MG capsule Take 200 mg by mouth 3 (three) times daily as needed for cough.    10/15/2018 at Unknown time    History reviewed. No pertinent family history.   Review of Systems:   ROS Review of systems not obtained due to patient factors.     Cardiac Review of Systems: Y or  [    ]= no  Chest Pain [ n   ]  Resting SOB [ y  ] Exertional SOB  [ y ]  Pollyann Kenned[place]thopnea [place][y  ]   Pedal Edema [ n  ]    Palpitations [ n ] Syncope  [ n ]   Presyncope [   ]  General Review of Systems: [Y] = yes [  ]=no Constitional: recent weight change [  ]; anorexia [  ]; fatigue y[  ]; nausea [  ]; night sweats [  ]; fever [  ]; or chills [  ]                                                                Dental: Last Dentist visit:   Eye : blurred vision [  ]; diplopia [   ]; vision changes [  ];  Amaurosis fugax[n  ]; Resp: cough [  ];  wheezing[  ];  hemoptysis[  ]; shortness of breath[  ]; paroxysmal nocturnal dyspnea[  ]; dyspnea on exertion[  ]; or orthopnea[  ];  GI:  gallstones[  ], vomiting[  ];  dysphagia[  ]; melena[  ];  hematochezia [  ]; heartburn[  ];   Hx of  Colonoscopy[  ]; GU: kidney stones [  ]; hematuria[  ];   dysuria [  ];  nocturia[  ];  history of     obstruction [  ]; urinary frequency [  ]             Skin: rash, swelling[  ];, hair loss[  ];  peripheral edema[  ];  or itching[  ]; Musculosketetal: myalgias[  ];  joint swelling[  ];  joint erythema[  ];  joint pain[  ];  back pain[  ];  Heme/Lymph: bruising[  ];  bleeding[  ];  anemia[  ];  Neuro: TIA[  ];  headaches[  ];  stroke[  ];  vertigo[  ];  seizures[  ];   paresthesias[  ];  difficulty walking[  ];  Psych:depression[  ]; anxiety[  ];  Endocrine: diabetes[  ];  thyroid dysfunction[  ];            unknown        Physical Exam: BP 108/75   Pulse (!) 109   Temp 98.4 F (36.9 C) (Oral)   Resp 19   Ht 5\' 3"  (1.6 m)   Wt 70.3 kg   SpO2 93%   BMI 27.46 kg/m    General appearance: deferred  Diagnostic Studies & Laboratory data:     Recent Radiology Findings:   Ct Angio Chest Pe W/cm &/or  Wo Cm  Result Date: 10/15/2018 CLINICAL DATA:  63 year old male positive COVID-19. Shortness of breath since yesterday. EXAM: CT ANGIOGRAPHY CHEST WITH CONTRAST TECHNIQUE: Multidetector CT imaging of the chest was performed using the standard protocol during bolus administration of intravenous contrast. Multiplanar CT image reconstructions and MIPs were obtained to evaluate the vascular anatomy. CONTRAST:  41mL OMNIPAQUE IOHEXOL 350 MG/ML SOLN COMPARISON:  Portable chest earlier today. FINDINGS: Cardiovascular: Good contrast bolus timing in the pulmonary arterial tree. No focal filling  defect identified in the pulmonary arteries to suggest acute pulmonary embolism. However, there is a 16 millimeter area of adherent thrombus in the distal ascending aorta, proximal arch (series 6, image 89, series 5, image 31. The visible proximal great vessels appear to remain patent. Negative visible aorta otherwise. Cardiac size at the upper limits of normal. No pericardial effusion. Mediastinum/Nodes: Borderline to mild reactive mediastinal and bilateral hilar lymph nodes. Lungs/Pleura: Widespread bilateral ground-glass and more confluent peribronchial opacity affecting all lobes. Areas in the superior segments of the lower lobes and posterior right upper lobe are bordering on consolidation. Major airways are patent. No pleural effusion. Upper Abdomen: Negative visible liver, gallbladder, spleen, pancreas, adrenal glands and bowel. Partially visible left renal cysts. Musculoskeletal: No acute osseous abnormality identified. Review of the MIP images confirms the above findings. IMPRESSION: 1. Positive for a 16 mm focus of adherent thrombus in the aorta (distal ascending/proximal arch - see series 5, image 31). The proximal great vessels appear to remain patent. 2. Negative for acute pulmonary embolus. 3. Widespread bilateral COVID-19 pneumonia.  No pleural effusion. 4. Negative visible upper abdomen. Study discussed by telephone with the ED provider on 10/15/2018 at 23:16 . Electronically Signed   By: Genevie Ann M.D.   On: 10/15/2018 23:22   Dg Chest Port 1 View  Result Date: 10/15/2018 CLINICAL DATA:  Covid19 positive EXAM: PORTABLE CHEST 1 VIEW COMPARISON:  09/29/2018 FINDINGS: Mild cardiac enlargement. Patchy bilateral mid and lower lung zone predominant interstitial and airspace opacities are again noted and appear increased from previous exam. IMPRESSION: 1. Interval progression of bilateral interstitial and airspace opacities. Electronically Signed   By: Kerby Moors M.D.   On: 10/15/2018 20:42     I  have independently reviewed the above radiologic studies and discussed with the patient   Recent Lab Findings: Lab Results  Component Value Date   WBC 7.3 10/15/2018   HGB 14.3 10/15/2018   HCT 42.9 10/15/2018   PLT 211 10/15/2018   GLUCOSE 107 (H) 10/15/2018   ALT 35 10/15/2018   AST 27 10/15/2018   NA 135 10/15/2018   K 4.6 10/15/2018   CL 105 10/15/2018   CREATININE 0.90 10/15/2018   BUN 10 10/15/2018   CO2 23 10/15/2018   HGBA1C 6.5 (H) 10/04/2018      Assessment / Plan:      Asymptomatic, incidentally noted intra-aortic thrombus; agree with anticoagulation with heparin. May consider a hypercoagulability work-up, although I have seen a few of these arise in true spontaneous fashion. Given active lung disease, he is not a candidate for open surgical removal of thrombus.     @ME1 @ 10/16/2018 2:58 PM

## 2018-10-16 NOTE — Progress Notes (Signed)
Daughter Barry Brunner [phone]) updated, states she would like to be the point of contact person.  Ellwood Handler, RN 10/16/18 3:27 PM

## 2018-10-16 NOTE — Progress Notes (Signed)
[place] for heparin Indication: thrombus in ascending aorta  No Known Allergies  Patient Measurements: Height: 5\' 3"  (160 cm) Weight: 155 lb (70.3 kg) IBW/kg (Calculated) : 56.9 Heparin Dosing Weight: 70 kg  Vital Signs: BP: 108/75 (07/01 1015) Pulse Rate: 109 (07/01 1015)  Labs: Recent Labs    10/15/18 2030 10/16/18 0248 10/16/18 0336 10/16/18 0554 10/16/18 1231  HGB 14.3  --   --   --   --   HCT 42.9  --   --   --   --   PLT 211  --   --   --   --   HEPARINUNFRC  --   --   --  0.49 0.59  CREATININE 0.90  --   --   --   --   TROPONINIHS  --  4 4  --   --     Estimated Creatinine Clearance: 72.1 mL/min (by C-G formula based on SCr of 0.9 mg/dL).   Medical History: Past Medical History:  Diagnosis Date  . Asthma     Assessment: 63 yo man with recent dx COVID-19 presents with SOB. 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 Plan to start heparin and consult Vascular Surgery.   Heparin level therapeutic x 2  Goal of Therapy:  Heparin level 0.3-0.7 units/ml Monitor platelets by anticoagulation protocol: Yes   Plan:  Continue heparin drip at 1250 units/hr Monitor daily heparin level and CBC, s/sx bleeding F/u Vascular Surgery plans  Anette Guarneri, PharmD Please check AMION for all [place] contact numbers Clinical Pharmacist 10/16/2018 1:08 PM

## 2018-10-16 NOTE — Progress Notes (Signed)
  Echocardiogram 2D Echocardiogram has been performed.  Charles Arias 10/16/2018, 4:01 PM

## 2018-10-16 NOTE — ED Provider Notes (Signed)
Patient presents to the emergency department for evaluation of worsening shortness of breath.  He was recently admitted for COVID-19 and reports that his symptoms are now worsening again.  He was signed out to me by Dr. Melina Copa to follow-up on CT scan.  CT was performed to evaluate for the possibility of PE.  There is no PE noted, but he does have an adherent thrombus in his a sending aorta.  He will therefore require hospitalization for heparinization.   Orpah Greek, MD 10/16/18 [phone]

## 2018-10-16 NOTE — ED Notes (Signed)
ED TO INPATIENT HANDOFF REPORT  ED Nurse Name and Phone #: Tray [place], [phone]  S Name/Age/Gender Francie Massing 63 y.o. male Room/Bed: 031C/031C  Code Status   Code Status: Full Code  Home/SNF/Other Home Patient oriented to: self, place, time and situation Is this baseline? Yes   Triage Complete: Triage complete  Chief Complaint SOB; Back Pain; Fever; Runny Nose (+COVID [id number])  Triage Note Pt tested positive for covid at the beginning of the month. Pt has been hospitalized for the virus and quarantined. Pt reports SOb that increased yesterday. Denies n/v/d. Complains of right upper back pain when he coughs.   Allergies No Known Allergies  Level of Care/Admitting Diagnosis ED Disposition    ED Disposition Condition Comment   Admit  Hospital Area: [facility] [[id number]]  Level of Care: Telemetry Medical [104]  Covid Evaluation: Person Under Investigation (PUI)  Diagnosis: Viral pneumonia [147829]  Admitting Physician: John Giovanni [[id number]]  Attending Physician: John Giovanni [[id number]]  Estimated length of stay: past midnight tomorrow  Certification:: I certify this patient will need inpatient services for at least 2 midnights  PT Class (Do Not Modify): Inpatient [101]  PT Acc Code (Do Not Modify): Private [1]       B Medical/Surgery History Past Medical History:  Diagnosis Date  . Asthma    History reviewed. No pertinent surgical history.   A IV Location/Drains/Wounds Patient Lines/Drains/Airways Status   Active Line/Drains/Airways    Name:   Placement date:   Placement time:   Site:   Days:   Peripheral IV 10/15/18 Left Antecubital   10/15/18    2202    Antecubital   1          Intake/Output Last 24 hours No intake or output data in the 24 hours ending 10/16/18 0744  Labs/Imaging Results for orders placed or performed during the hospital encounter of 10/15/18 (from the past 48 hour(s))  Comprehensive metabolic panel      Status: Abnormal   Collection Time: 10/15/18  8:30 PM  Result Value Ref Range   Sodium 135 135 - 145 mmol/L   Potassium 4.6 3.5 - 5.1 mmol/L   Chloride 105 98 - 111 mmol/L   CO2 23 22 - 32 mmol/L   Glucose, Bld 107 (H) 70 - 99 mg/dL   BUN 10 8 - 23 mg/dL   Creatinine, Ser 6.29 0.61 - 1.24 mg/dL   Calcium 8.4 (L) 8.9 - 10.3 mg/dL   Total Protein 5.4 (L) 6.5 - 8.1 g/dL   Albumin 3.0 (L) 3.5 - 5.0 g/dL   AST 27 15 - 41 U/L   ALT 35 0 - 44 U/L   Alkaline Phosphatase 57 38 - 126 U/L   Total Bilirubin 1.0 0.3 - 1.2 mg/dL   GFR calc non Af Amer >60 >60 mL/min   GFR calc Af Amer >60 >60 mL/min   Anion gap 7 5 - 15    Comment: Performed at [facility], [address], [place], [place] [address]  CBC with Differential     Status: Abnormal   Collection Time: 10/15/18  8:30 PM  Result Value Ref Range   WBC 7.3 4.0 - 10.5 K/uL   RBC 4.59 4.22 - 5.81 MIL/uL   Hemoglobin 14.3 13.0 - 17.0 g/dL   HCT 32.4 40.1 - 02.7 %   MCV 93.5 80.0 - 100.0 fL   MCH 31.2 26.0 - 34.0 pg   MCHC 33.3 30.0 - 36.0 g/dL  RDW 13.5 11.5 - 15.5 %   Platelets 211 150 - 400 K/uL   nRBC 0.0 0.0 - 0.2 %   Neutrophils Relative % 59 %   Neutro Abs 4.4 1.7 - 7.7 K/uL   Lymphocytes Relative 22 %   Lymphs Abs 1.6 0.7 - 4.0 K/uL   Monocytes Relative 15 %   Monocytes Absolute 1.1 (H) 0.1 - 1.0 K/uL   Eosinophils Relative 2 %   Eosinophils Absolute 0.1 0.0 - 0.5 K/uL   Basophils Relative 1 %   Basophils Absolute 0.1 0.0 - 0.1 K/uL   Immature Granulocytes 1 %   Abs Immature Granulocytes 0.05 0.00 - 0.07 K/uL    Comment: Performed at [facility], [address] [place], [place] [address]  SARS Coronavirus 2 (CEPHEID- Performed in [facility] hospital lab), Hosp Order     Status: Abnormal   Collection Time: 10/16/18 12:24 AM   Specimen: Nasopharyngeal Swab  Result Value Ref Range   SARS Coronavirus 2 POSITIVE (A) NEGATIVE    Comment: RESULT CALLED TO, READ BACK BY AND VERIFIED WITH: RN T JORDEN @ 10/16/18  BY S GEZAHEGN (NOTE) If result is NEGATIVE SARS-CoV-2 target nucleic acids are NOT DETECTED. The SARS-CoV-2 RNA is generally detectable in upper and lower  respiratory specimens during the acute phase of infection. The lowest  concentration of SARS-CoV-2 viral copies this assay can detect is 250  copies / mL. A negative result does not preclude SARS-CoV-2 infection  and should not be used as the sole basis for treatment or other  patient management decisions.  A negative result may occur with  improper specimen collection / handling, submission of specimen other  than nasopharyngeal swab, presence of viral mutation(s) within the  areas targeted by this assay, and inadequate number of viral copies  (<250 copies / mL). A negative result must be combined with clinical  observations, patient history, and epidemiological information. If result is POSITIVE SARS-CoV-2 target nucleic acids are DETECTED. Th e SARS-CoV-2 RNA is generally detectable in upper and lower  respiratory specimens during the acute phase of infection.  Positive  results are indicative of active infection with SARS-CoV-2.  Clinical  correlation with patient history and other diagnostic information is  necessary to determine patient infection status.  Positive results do  not rule out bacterial infection or co-infection with other viruses. If result is PRESUMPTIVE POSTIVE SARS-CoV-2 nucleic acids MAY BE PRESENT.   A presumptive positive result was obtained on the submitted specimen  and confirmed on repeat testing.  While 2019 novel coronavirus  (SARS-CoV-2) nucleic acids may be present in the submitted sample  additional confirmatory testing may be necessary for epidemiological  and / or clinical management purposes  to differentiate between  SARS-CoV-2 and other Sarbecovirus currently known to infect humans.  If clinically indicated additional testing with an alternate test  methodology [phone] is  advised. The  SARS-CoV-2 RNA is generally  detectable in upper and lower respiratory specimens during the acute  phase of infection. The expected result is Negative. Fact Sheet for Patients:  BoilerBrush.com.cyhttps://www.fda.gov/media/136312/download Fact Sheet for Healthcare Providers: https://pope.com/https://www.fda.gov/media/136313/download This test is not yet approved or cleared by the [place] FDA and has been authorized for detection and/or diagnosis of SARS-CoV-2 by FDA under an Emergency Use Authorization (EUA).  This EUA will remain in effect (meaning this test can be used) for the duration of the COVID-19 declaration under Section 564(b)(1) of the Act, 21 U.S.C. section 360bbb-3(b)(1), unless the authorization  is terminated or revoked sooner. Performed at [facility], [address] [place], [place] [address]   C-reactive protein     Status: None   Collection Time: 10/16/18  2:48 AM  Result Value Ref Range   CRP <0.8 <1.0 mg/dL    Comment: Performed at [facility], [address] [place], [place] [address]  D-dimer, quantitative (not at [facility])     Status: Abnormal   Collection Time: 10/16/18  2:48 AM  Result Value Ref Range   D-Dimer, Quant 1.67 (H) 0.00 - 0.50 ug/mL-FEU    Comment: (NOTE) At the manufacturer cut-off of 0.50 ug/mL FEU, this assay has been documented to exclude PE with a sensitivity and negative predictive value of 97 to 99%.  At this time, this assay has not been approved by the FDA to exclude DVT/VTE. Results should be correlated with clinical presentation. Performed at [facility], [address] [place], [place] [address]   Ferritin     Status: None   Collection Time: 10/16/18  2:48 AM  Result Value Ref Range   Ferritin 301 24 - 336 ng/mL    Comment: Performed at [facility], [address] [place], [place] [address]  Fibrinogen     Status: None   Collection Time: 10/16/18  2:48 AM  Result Value Ref Range   Fibrinogen 379 210 - 475 mg/dL    Comment:  Performed at [facility], [address] [place], [place] [address]  Lactate dehydrogenase     Status: None   Collection Time: 10/16/18  2:48 AM  Result Value Ref Range   LDH 190 98 - 192 U/L    Comment: Performed at [facility], [address] [place], [place] [address]  Procalcitonin     Status: None   Collection Time: 10/16/18  2:48 AM  Result Value Ref Range   Procalcitonin <0.10 ng/mL    Comment:        Interpretation: PCT (Procalcitonin) <= 0.5 ng/mL: Systemic infection (sepsis) is not likely. Local bacterial infection is possible. (NOTE)       Sepsis PCT Algorithm           Lower Respiratory Tract                                      Infection PCT Algorithm    ----------------------------     ----------------------------         PCT < 0.25 ng/mL                PCT < 0.10 ng/mL         Strongly encourage             Strongly discourage   discontinuation of antibiotics    initiation of antibiotics    ----------------------------     -----------------------------       PCT 0.25 - 0.50 ng/mL            PCT 0.10 - 0.25 ng/mL               OR       >80% decrease in PCT            Discourage initiation of  antibiotics      Encourage discontinuation           of antibiotics    ----------------------------     -----------------------------         PCT >= 0.50 ng/mL              PCT 0.26 - 0.50 ng/mL               AND        <80% decrease in PCT             Encourage initiation of                                             antibiotics       Encourage continuation           of antibiotics    ----------------------------     -----------------------------        PCT >= 0.50 ng/mL                  PCT > 0.50 ng/mL               AND         increase in PCT                  Strongly encourage                                      initiation of antibiotics    Strongly encourage escalation           of antibiotics                                      -----------------------------                                           PCT <= 0.25 ng/mL                                                 OR                                        > 80% decrease in PCT                                     Discontinue / Do not initiate                                             antibiotics Performed at [facility], [address] [place], [place] [address]   Troponin I (High Sensitivity)     Status: None   Collection  Time: 10/16/18  2:48 AM  Result Value Ref Range   Troponin I (High Sensitivity) 4 <18 ng/L    Comment: (NOTE) Elevated high sensitivity troponin I (hsTnI) values and significant  changes across serial measurements may suggest ACS but many other  chronic and acute conditions are known to elevate hsTnI results.  Refer to the "Links" section for chest pain algorithms and additional  guidance. Performed at [facility], [address] [place], [place] [address]   Troponin I (High Sensitivity)     Status: None   Collection Time: 10/16/18  3:36 AM  Result Value Ref Range   Troponin I (High Sensitivity) 4 <18 ng/L    Comment: (NOTE) Elevated high sensitivity troponin I (hsTnI) values and significant  changes across serial measurements may suggest ACS but many other  chronic and acute conditions are known to elevate hsTnI results.  Refer to the "Links" section for chest pain algorithms and additional  guidance. Performed at [facility], [address], [place], [place] [address]   Heparin level (unfractionated)     Status: None   Collection Time: 10/16/18  5:54 AM  Result Value Ref Range   Heparin Unfractionated 0.49 0.30 - 0.70 IU/mL    Comment: (NOTE) If heparin results are below expected values, and patient dosage has  been confirmed, suggest follow up testing of antithrombin III levels. Performed at [facility], [address] [place], [place] [address]    Ct Angio Chest Pe  W/cm &/or Wo Cm  Result Date: 10/15/2018 CLINICAL DATA:  63 year old male positive COVID-19. Shortness of breath since yesterday. EXAM: CT ANGIOGRAPHY CHEST WITH CONTRAST TECHNIQUE: Multidetector CT imaging of the chest was performed using the standard protocol during bolus administration of intravenous contrast. Multiplanar CT image reconstructions and MIPs were obtained to evaluate the vascular anatomy. CONTRAST:  75mL OMNIPAQUE IOHEXOL 350 MG/ML SOLN COMPARISON:  Portable chest earlier today. FINDINGS: Cardiovascular: Good contrast bolus timing in the pulmonary arterial tree. No focal filling defect identified in the pulmonary arteries to suggest acute pulmonary embolism. However, there is a 16 millimeter area of adherent thrombus in the distal ascending aorta, proximal arch (series 6, image 89, series 5, image 31. The visible proximal great vessels appear to remain patent. Negative visible aorta otherwise. Cardiac size at the upper limits of normal. No pericardial effusion. Mediastinum/Nodes: Borderline to mild reactive mediastinal and bilateral hilar lymph nodes. Lungs/Pleura: Widespread bilateral ground-glass and more confluent peribronchial opacity affecting all lobes. Areas in the superior segments of the lower lobes and posterior right upper lobe are bordering on consolidation. Major airways are patent. No pleural effusion. Upper Abdomen: Negative visible liver, gallbladder, spleen, pancreas, adrenal glands and bowel. Partially visible left renal cysts. Musculoskeletal: No acute osseous abnormality identified. Review of the MIP images confirms the above findings. IMPRESSION: 1. Positive for a 16 mm focus of adherent thrombus in the aorta (distal ascending/proximal arch - see series 5, image 31). The proximal great vessels appear to remain patent. 2. Negative for acute pulmonary embolus. 3. Widespread bilateral COVID-19 pneumonia.  No pleural effusion. 4. Negative visible upper abdomen. Study discussed by  telephone with the ED provider on 10/15/2018 at 23:16 . Electronically Signed   By: Odessa Fleming M.D.   On: 10/15/2018 23:22   Dg Chest Port 1 View  Result Date: 10/15/2018 CLINICAL DATA:  Covid19 positive EXAM: PORTABLE CHEST 1 VIEW COMPARISON:  09/29/2018 FINDINGS: Mild cardiac enlargement. Patchy bilateral mid and lower lung zone  predominant interstitial and airspace opacities are again noted and appear increased from previous exam. IMPRESSION: 1. Interval progression of bilateral interstitial and airspace opacities. Electronically Signed   By: Kerby Moors M.D.   On: 10/15/2018 20:42    Pending Labs Unresulted Labs (From admission, onward)    Start     Ordered   10/17/18 0500  Heparin level (unfractionated)  Daily,   R     10/16/18 0033   10/17/18 0500  CBC  Daily,   R     10/16/18 0033          Vitals/Pain Today's Vitals   10/16/18 0030 10/16/18 0045 10/16/18 0130 10/16/18 0249  BP: 121/84 123/81 126/90 132/87  Pulse: 98 98 97 (!) 123  Resp: (!) 26 (!) 24 (!) 23 (!) 26  Temp:      TempSrc:      SpO2: 92% 93% 95% 96%  Weight:      Height:      PainSc:        Isolation Precautions Airborne and Contact precautions  Medications Medications  albuterol (VENTOLIN HFA) 108 (90 Base) MCG/ACT inhaler 2 puff (2 puffs Inhalation Given 10/15/18 2203)  heparin ADULT infusion 100 units/mL (25000 units/254mL sodium chloride 0.45%) (1,250 Units/hr Intravenous New Bag/Given 10/15/18 2358)  acetaminophen (TYLENOL) tablet 650 mg (has no administration in time range)  guaiFENesin-dextromethorphan (ROBITUSSIN DM) 100-10 MG/5ML syrup 10 mL (has no administration in time range)  methylPREDNISolone sodium succinate (SOLU-MEDROL) 125 mg/2 mL injection 125 mg (125 mg Intravenous Given 10/15/18 2203)  iohexol (OMNIPAQUE) 350 MG/ML injection 75 mL (75 mLs Intravenous Contrast Given 10/15/18 2255)  heparin bolus via infusion 5,000 Units (5,000 Units Intravenous Bolus from Bag 10/15/18 2358)     Mobility walks Low fall risk   Focused Assessments Pulmonary Assessment Handoff:  Lung sounds: Bilateral Breath Sounds: Diminished L Breath Sounds: Diminished R Breath Sounds: Diminished O2 Device: Room Air        R Recommendations: See Admitting Provider Note  Report given to:   Additional Notes: Pt becomes extremely tachy when sitting up to use the restroom

## 2018-10-17 LAB — CBC
HCT: 41.1 % (ref 39.0–52.0)
Hemoglobin: 13.8 g/dL (ref 13.0–17.0)
MCH: 30.7 pg (ref 26.0–34.0)
MCHC: 33.6 g/dL (ref 30.0–36.0)
MCV: 91.3 fL (ref 80.0–100.0)
Platelets: 231 10*3/uL (ref 150–400)
RBC: 4.5 MIL/uL (ref 4.22–5.81)
RDW: 13.3 % (ref 11.5–15.5)
WBC: 11.7 10*3/uL — ABNORMAL HIGH (ref 4.0–10.5)
nRBC: 0.2 % (ref 0.0–0.2)

## 2018-10-17 LAB — BASIC METABOLIC PANEL
Anion gap: 7 (ref 5–15)
BUN: 14 mg/dL (ref 8–23)
CO2: 22 mmol/L (ref 22–32)
Calcium: 8.7 mg/dL — ABNORMAL LOW (ref 8.9–10.3)
Chloride: 105 mmol/L (ref 98–111)
Creatinine, Ser: 0.86 mg/dL (ref 0.61–1.24)
GFR calc Af Amer: >60 mL/min (ref >60)
GFR calc non Af Amer: >60 mL/min (ref >60)
Glucose, Bld: 163 mg/dL — ABNORMAL HIGH (ref 70–99)
Potassium: 4.3 mmol/L (ref 3.5–5.1)
Sodium: 134 mmol/L — ABNORMAL LOW (ref 135–145)

## 2018-10-17 LAB — HEPARIN LEVEL (UNFRACTIONATED): Heparin Unfractionated: 0.48 IU/mL (ref 0.30–0.70)

## 2018-10-17 NOTE — Progress Notes (Signed)
PROGRESS NOTE    Charles Arias  ZOX:[id number] DOB: 1956-01-08 DOA: 10/15/2018 PCP: Patient, No Pcp Per    Brief Narrative:  Charles Arias is a 63 y.o. male with medical history significant of asthma, recent admission to the hospital from September 29, 2018 to October 05, 2018 for COVID PNA presenting to the hospital for evaluation of worsening shortness of breath.  Chest x-ray showing interval progression of bilateral interstitial and airspace opacities.  CT angiogram negative for PE.  Showing widespread bilateral COVID-19 pneumonia.  In addition, positive for a 16 mm focus of adherent thrombus in the aorta (distal ascending/proximal arch).  The proximal great vessels appear to remain patent.  Patient was started on heparin infusion in the ED and received Solu-Medrol 125 mg.  Assessment & Plan:   Principal Problem:   Pneumonia due to COVID-19 virus Active Problems:   Aortic thrombus (HCC)   Asthma   covid 19 viral pneumonia:  Ct CHEST shows widespread bil pneumonia.  Oxygen sats at 92% on RA, but patient reports cough with some blood tinged sputum earlier today . No fever today.  Continue with airborne and contact precautions, oxygen as needed to keep sats greater than 90%. Continue with IV solumedrol and IV azithromycin daily.   Incidental finding of aortic thrombus on CT angiogram of the chest Patient was started on IV heparin.  Echocardiogram was ordered and reviewed.  Cardiothoracic surgeon consulted and recommendations given.   In view of the active viral infection, no surgical intervention planned this admission. Hypercoagulable work up ordered.      Asthma No wheezing heard Bronchodilators as needed.   DVT prophylaxis:  heparin. Code Status: Full code Family Communication: None at bedside, discussed the plan with the patient. Disposition Plan: transfer to [facility]. Discussed with Dr David StallFeliz Ortiz    Consultants:   Cardiothoracic surgery   Procedures:   Echo  Antimicrobials: azithromycin   Subjective: Cough with blood tinged sputum earlier today.   Objective: Vitals:   10/16/18 1051 10/16/18 1607 10/17/18 0036 10/17/18 0858  BP: (!) 146/99 117/78 109/78 137/88  Pulse: (!) 111 (!) 102 90 (!) 101  Resp: 20     Temp: 98.6 F (37 C) 98.3 F (36.8 C) 98.6 F (37 C) 98.4 F (36.9 C)  TempSrc: Oral Oral Oral Oral  SpO2: 95% 95% 93% 93%  Weight:      Height:        Intake/Output Summary (Last 24 hours) at 10/17/2018 1038 Last data filed at 10/17/2018 [id number] Gross per 24 hour  Intake 677.34 ml  Output 700 ml  Net -22.66 ml   Filed Weights   10/15/18 2300  Weight: 70.3 kg    Examination:  General exam: Appears calm and comfortable  Respiratory system: Clear to auscultation. Respiratory effort normal. Cardiovascular system: S1 & S2 heard, RRR. No JVD,  Gastrointestinal system: Abdomen is nondistended, soft and nontender Central nervous system: Alert and oriented. No focal neurological deficits. Extremities: Symmetric 5 x 5 power. Skin: No rashes, lesions or ulcers  Psychiatry:  Mood & affect appropriate.     Data Reviewed: I have personally reviewed following labs and imaging studies  CBC: Recent Labs  Lab 10/15/18 2030 10/17/18 0443  WBC 7.3 11.7*  NEUTROABS 4.4  --   HGB 14.3 13.8  HCT 42.9 41.1  MCV 93.5 91.3  PLT 211 231   Basic Metabolic Panel: Recent Labs  Lab 10/15/18 2030 10/17/18 0443  NA 135 134*  K 4.6 4.3  CL 105  105  CO2 23 22  GLUCOSE 107* 163*  BUN 10 14  CREATININE 0.90 0.86  CALCIUM 8.4* 8.7*   GFR: Estimated Creatinine Clearance: 75.5 mL/min (by C-G formula based on SCr of 0.86 mg/dL). Liver Function Tests: Recent Labs  Lab 10/15/18 2030  AST 27  ALT 35  ALKPHOS 57  BILITOT 1.0  PROT 5.4*  ALBUMIN 3.0*   No results for input(s): LIPASE, AMYLASE in the last 168 hours. No results for input(s): AMMONIA in the last 168 hours. Coagulation Profile: No results for input(s): INR,  PROTIME in the last 168 hours. Cardiac Enzymes: No results for input(s): CKTOTAL, CKMB, CKMBINDEX, TROPONINI in the last 168 hours. BNP (last 3 results) No results for input(s): PROBNP in the last 8760 hours. HbA1C: No results for input(s): HGBA1C in the last 72 hours. CBG: No results for input(s): GLUCAP in the last 168 hours. Lipid Profile: No results for input(s): CHOL, HDL, LDLCALC, TRIG, CHOLHDL, LDLDIRECT in the last 72 hours. Thyroid Function Tests: No results for input(s): TSH, T4TOTAL, FREET4, T3FREE, THYROIDAB in the last 72 hours. Anemia Panel: Recent Labs    10/16/18 0248  FERRITIN 301   Sepsis Labs: Recent Labs  Lab 10/16/18 0248  PROCALCITON <0.10    Recent Results (from the past 240 hour(s))  SARS Coronavirus 2 (CEPHEID- Performed in [facility] hospital lab), Hosp Order     Status: Abnormal   Collection Time: 10/16/18 12:24 AM   Specimen: Nasopharyngeal Swab  Result Value Ref Range Status   SARS Coronavirus 2 POSITIVE (A) NEGATIVE Final    Comment: RESULT CALLED TO, READ BACK BY AND VERIFIED WITH: RN T JORDEN @ 10/16/18 BY S GEZAHEGN (NOTE) If result is NEGATIVE SARS-CoV-2 target nucleic acids are NOT DETECTED. The SARS-CoV-2 RNA is generally detectable in upper and lower  respiratory specimens during the acute phase of infection. The lowest  concentration of SARS-CoV-2 viral copies this assay can detect is 250  copies / mL. A negative result does not preclude SARS-CoV-2 infection  and should not be used as the sole basis for treatment or other  patient management decisions.  A negative result may occur with  improper specimen collection / handling, submission of specimen other  than nasopharyngeal swab, presence of viral mutation(s) within the  areas targeted by this assay, and inadequate number of viral copies  (<250 copies / mL). A negative result must be combined with clinical  observations, patient history, and epidemiological information. If result  is POSITIVE SARS-CoV-2 target nucleic acids are DETECTED. Th e SARS-CoV-2 RNA is generally detectable in upper and lower  respiratory specimens during the acute phase of infection.  Positive  results are indicative of active infection with SARS-CoV-2.  Clinical  correlation with patient history and other diagnostic information is  necessary to determine patient infection status.  Positive results do  not rule out bacterial infection or co-infection with other viruses. If result is PRESUMPTIVE POSTIVE SARS-CoV-2 nucleic acids MAY BE PRESENT.   A presumptive positive result was obtained on the submitted specimen  and confirmed on repeat testing.  While 2019 novel coronavirus  (SARS-CoV-2) nucleic acids may be present in the submitted sample  additional confirmatory testing may be necessary for epidemiological  and / or clinical management purposes  to differentiate between  SARS-CoV-2 and other Sarbecovirus currently known to infect humans.  If clinically indicated additional testing with an alternate test  methodology [phone]) is  advised. The SARS-CoV-2 RNA is generally  detectable in upper and  lower respiratory specimens during the acute  phase of infection. The expected result is Negative. Fact Sheet for Patients:  BoilerBrush.com.cyhttps://www.fda.gov/media/136312/download Fact Sheet for Healthcare Providers: https://pope.com/https://www.fda.gov/media/136313/download This test is not yet approved or cleared by the [place] FDA and has been authorized for detection and/or diagnosis of SARS-CoV-2 by FDA under an Emergency Use Authorization (EUA).  This EUA will remain in effect (meaning this test can be used) for the duration of the COVID-19 declaration under Section 564(b)(1) of the Act, 21 U.S.C. section 360bbb-3(b)(1), unless the authorization is terminated or revoked sooner. Performed at [facility], [address] [place], [place] [address]          Radiology Studies: Ct Angio Chest Pe  W/cm &/or Wo Cm  Result Date: 10/15/2018 CLINICAL DATA:  63 year old male positive COVID-19. Shortness of breath since yesterday. EXAM: CT ANGIOGRAPHY CHEST WITH CONTRAST TECHNIQUE: Multidetector CT imaging of the chest was performed using the standard protocol during bolus administration of intravenous contrast. Multiplanar CT image reconstructions and MIPs were obtained to evaluate the vascular anatomy. CONTRAST:  75mL OMNIPAQUE IOHEXOL 350 MG/ML SOLN COMPARISON:  Portable chest earlier today. FINDINGS: Cardiovascular: Good contrast bolus timing in the pulmonary arterial tree. No focal filling defect identified in the pulmonary arteries to suggest acute pulmonary embolism. However, there is a 16 millimeter area of adherent thrombus in the distal ascending aorta, proximal arch (series 6, image 89, series 5, image 31. The visible proximal great vessels appear to remain patent. Negative visible aorta otherwise. Cardiac size at the upper limits of normal. No pericardial effusion. Mediastinum/Nodes: Borderline to mild reactive mediastinal and bilateral hilar lymph nodes. Lungs/Pleura: Widespread bilateral ground-glass and more confluent peribronchial opacity affecting all lobes. Areas in the superior segments of the lower lobes and posterior right upper lobe are bordering on consolidation. Major airways are patent. No pleural effusion. Upper Abdomen: Negative visible liver, gallbladder, spleen, pancreas, adrenal glands and bowel. Partially visible left renal cysts. Musculoskeletal: No acute osseous abnormality identified. Review of the MIP images confirms the above findings. IMPRESSION: 1. Positive for a 16 mm focus of adherent thrombus in the aorta (distal ascending/proximal arch - see series 5, image 31). The proximal great vessels appear to remain patent. 2. Negative for acute pulmonary embolus. 3. Widespread bilateral COVID-19 pneumonia.  No pleural effusion. 4. Negative visible upper abdomen. Study discussed by  telephone with the ED provider on 10/15/2018 at 23:16 . Electronically Signed   By: Odessa FlemingH  Hall M.D.   On: 10/15/2018 23:22   Dg Chest Port 1 View  Result Date: 10/15/2018 CLINICAL DATA:  Covid19 positive EXAM: PORTABLE CHEST 1 VIEW COMPARISON:  09/29/2018 FINDINGS: Mild cardiac enlargement. Patchy bilateral mid and lower lung zone predominant interstitial and airspace opacities are again noted and appear increased from previous exam. IMPRESSION: 1. Interval progression of bilateral interstitial and airspace opacities. Electronically Signed   By: Signa Kellaylor  Stroud M.D.   On: 10/15/2018 20:42        Scheduled Meds:  methylPREDNISolone (SOLU-MEDROL) injection  60 mg Intravenous Q24H   Continuous Infusions:  azithromycin 500 mg (10/16/18 2056)   heparin 1,250 Units/hr (10/16/18 1617)     LOS: 1 day    Time spent: 30 minutes.     Kathlen ModyVijaya Lizandro Spellman, MD [facility] Pager [phone]  If 7PM-7AM, please contact night-coverage [email or web address] Password [email or web address] 10/17/2018, 10:38 AM

## 2018-10-17 NOTE — TOC Benefit Eligibility Note (Signed)
Transition of Care [facility]) Benefit Eligibility Note    Patient Details  Name: Charles Arias MRN: [id number] Date of Birth: 06-24-1955                           Additional Notes: PRIMARY INS : MEDICARE  PART A and B,   NO PHARMACY BENEFIT ON FILE, CALL [facility] NO RX COVERAGE    Memory Argue Phone Number: 10/17/2018, 1:10 PM

## 2018-10-17 NOTE — Progress Notes (Signed)
[place] for heparin Indication: thrombus in ascending aorta  No Known Allergies  Patient Measurements: Height: 5\' 3"  (160 cm) Weight: 155 lb (70.3 kg) IBW/kg (Calculated) : 56.9 Heparin Dosing Weight: 70 kg  Vital Signs: Temp: 98.6 F (37 C) (07/02 0036) Temp Source: Oral (07/02 0036) BP: 109/78 (07/02 0036) Pulse Rate: 90 (07/02 0036)  Labs: Recent Labs    10/15/18 2030 10/16/18 0248 10/16/18 0336 10/16/18 0554 10/16/18 1231 10/17/18 0443  HGB 14.3  --   --   --   --  13.8  HCT 42.9  --   --   --   --  41.1  PLT 211  --   --   --   --  231  HEPARINUNFRC  --   --   --  0.49 0.59 0.48  CREATININE 0.90  --   --   --   --  0.86  TROPONINIHS  --  4 4  --   --   --     Estimated Creatinine Clearance: 75.5 mL/min (by C-G formula based on SCr of 0.86 mg/dL).   Medical History: Past Medical History:  Diagnosis Date  . Asthma     Assessment: 63 yo man with recent dx COVID-19 presents with SOB. 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 Plan to start heparin and consult Vascular Surgery.   Heparin level therapeutic CBC stable  Goal of Therapy:  Heparin level 0.3-0.7 units/ml Monitor platelets by anticoagulation protocol: Yes   Plan:  Continue heparin drip at 1250 units/hr Monitor daily heparin level and CBC, s/sx bleeding   Anette Guarneri, PharmD Please check AMION for all [place] contact numbers Clinical Pharmacist 10/17/2018 8:21 AM

## 2018-10-17 NOTE — Progress Notes (Signed)
Benefit check in process for eliquis/xarelto.

## 2018-10-18 LAB — CBC
HCT: 43.2 % (ref 39.0–52.0)
Hemoglobin: 13.9 g/dL (ref 13.0–17.0)
MCH: 30.5 pg (ref 26.0–34.0)
MCHC: 32.2 g/dL (ref 30.0–36.0)
MCV: 94.7 fL (ref 80.0–100.0)
Platelets: 213 10*3/uL (ref 150–400)
RBC: 4.56 MIL/uL (ref 4.22–5.81)
RDW: 13.4 % (ref 11.5–15.5)
WBC: 7.4 10*3/uL (ref 4.0–10.5)
nRBC: 0 % (ref 0.0–0.2)

## 2018-10-18 LAB — HEPARIN LEVEL (UNFRACTIONATED)
Heparin Unfractionated: 0.2 IU/mL — ABNORMAL LOW (ref 0.30–0.70)
Heparin Unfractionated: 0.36 IU/mL (ref 0.30–0.70)

## 2018-10-18 LAB — HOMOCYSTEINE: Homocysteine: 5.9 umol/L (ref 0.0–17.2)

## 2018-10-18 NOTE — Progress Notes (Signed)
[facility] PROGRESS NOTE    Progress Note  Charles Arias  [id number] DOB: [birth year] DOA: 10/15/2018 PCP: Patient, No Pcp Per     Brief Narrative:   Charles Arias is an 63 y.o. male past medical history significant for asthma recently discharged from the hospital October 05, 2018 for COVID-19 pneumonia, was doing fairly good when discharged from the hospital but is started having productive cough and shortness of breath in the ED he was found to be slightly tachypneic saturating 100% on room air chest x-ray showed interval progression of airspace opacity, CT angiogram show widespread diffuse interstitial pattern and a 16mm focus of an adherent thrombus in the aorta.  Patient was started on IV heparin ER thoracic has evaluated the patient and his the patient was asymptomatic agreed with anticoagulation  Assessment/Plan:   Pneumonia due to COVID-19 virus: He was saturating 92% on room air on admission and reported some hemoptysis. He has remained afebrile, with no leukocytosis he was started empirically on IV Solu-Medrol and IV azithromycin.  Aortic thrombus (HCC): Incidental finding on CT Angio of the chest, was started on IV heparin.  Cardiothoracic surgery was consulted and recommended no surgical intervention and agreed with anticoagulation in the setting of active viral infection. Spoke with the cardiothoracic surgeon Dr. Dorris FetchHendrickson today who recommended to switch him over to Eliquis once the patient is able to go home.  DVT prophylaxis: heparin Family Communication:none Disposition Plan/Barrier to D/C: home in 2 days Code Status:     Code Status Orders  (From admission, onward)         Start     Ordered   10/16/18 0136  Full code  Continuous     10/16/18 0136        Code Status History    Date Active Date Inactive Code Status Order ID Comments User Context   09/30/2018 0110 10/05/2018 1712 Full Code [id number]  Hillary BowGardner, Jared M, DO ED   Advance Care Planning  Activity        IV Access:    Peripheral IV   Procedures and diagnostic studies:   No results found.   Medical Consultants:    None.  Anti-Infectives:   azithromycin  Subjective:    Charles Arias he relates his breathing is unchanged compared to yesterday.  Objective:    Vitals:   10/17/18 1453 10/17/18 2146 10/18/18 0000 10/18/18 0613  BP:  117/83 116/84 (!) 108/92  Pulse:  99 88 94  Resp:  19 18 19   Temp: 97.9 F (36.6 C) 98.3 F (36.8 C)  98 F (36.7 C)  TempSrc: Oral Oral  Oral  SpO2:  95% 93% 94%  Weight:      Height:        Intake/Output Summary (Last 24 hours) at 10/18/2018 0657 Last data filed at 10/17/2018 2050 Gross per 24 hour  Intake 591.88 ml  Output 300 ml  Net 291.88 ml   Filed Weights   10/15/18 2300  Weight: 70.3 kg    Exam: General exam: In no acute distress. Respiratory system: Good air movement and feels crackles at bases. Cardiovascular system: S1 & S2 heard, RRR. No JVD. Gastrointestinal system: Abdomen is nondistended, soft and nontender.  Central nervous system: Alert and oriented. No focal neurological deficits. Extremities: No pedal edema. Skin: No rashes, lesions or ulcers Psychiatry: Judgement and insight appear normal. Mood & affect appropriate.   Data Reviewed:    Labs: Basic Metabolic Panel: Recent Labs  Lab 10/15/18 2030 10/17/18 0443  NA 135 134*  K 4.6 4.3  CL 105 105  CO2 23 22  GLUCOSE 107* 163*  BUN 10 14  CREATININE 0.90 0.86  CALCIUM 8.4* 8.7*   GFR Estimated Creatinine Clearance: 75.5 mL/min (by C-G formula based on SCr of 0.86 mg/dL). Liver Function Tests: Recent Labs  Lab 10/15/18 2030  AST 27  ALT 35  ALKPHOS 57  BILITOT 1.0  PROT 5.4*  ALBUMIN 3.0*   No results for input(s): LIPASE, AMYLASE in the last 168 hours. No results for input(s): AMMONIA in the last 168 hours. Coagulation profile No results for input(s): INR, PROTIME in the last 168 hours. COVID-19 Labs  Recent  Labs    10/16/18 0248  DDIMER 1.67*  FERRITIN 301  LDH 190  CRP <0.8    Lab Results  Component Value Date   SARSCOV2NAA POSITIVE (A) 10/16/2018   SARSCOV2NAA DETECTED (A) 09/29/2018    CBC: Recent Labs  Lab 10/15/18 2030 10/17/18 0443 10/18/18 0500  WBC 7.3 11.7* 7.4  NEUTROABS 4.4  --   --   HGB 14.3 13.8 13.9  HCT 42.9 41.1 43.2  MCV 93.5 91.3 94.7  PLT 211 231 213   Cardiac Enzymes: No results for input(s): CKTOTAL, CKMB, CKMBINDEX, TROPONINI in the last 168 hours. BNP (last 3 results) No results for input(s): PROBNP in the last 8760 hours. CBG: No results for input(s): GLUCAP in the last 168 hours. D-Dimer: Recent Labs    10/16/18 0248  DDIMER 1.67*   Hgb A1c: No results for input(s): HGBA1C in the last 72 hours. Lipid Profile: No results for input(s): CHOL, HDL, LDLCALC, TRIG, CHOLHDL, LDLDIRECT in the last 72 hours. Thyroid function studies: No results for input(s): TSH, T4TOTAL, T3FREE, THYROIDAB in the last 72 hours.  Invalid input(s): FREET3 Anemia work up: Recent Labs    10/16/18 [place] 301   Sepsis Labs: Recent Labs  Lab 10/15/18 2030 10/16/18 0248 10/17/18 0443 10/18/18 0500  PROCALCITON  --  <0.10  --   --   WBC 7.3  --  11.7* 7.4   Microbiology Recent Results (from the past 240 hour(s))  SARS Coronavirus 2 (CEPHEID- Performed in [place] hospital lab), Hosp Order     Status: Abnormal   Collection Time: 10/16/18 12:24 AM   Specimen: Nasopharyngeal Swab  Result Value Ref Range Status   SARS Coronavirus 2 POSITIVE (A) NEGATIVE Final    Comment: RESULT CALLED TO, READ BACK BY AND VERIFIED WITH: RN T JORDEN @ 10/16/18 BY S GEZAHEGN (NOTE) If result is NEGATIVE SARS-CoV-2 target nucleic acids are NOT DETECTED. The SARS-CoV-2 RNA is generally detectable in upper and lower  respiratory specimens during the acute phase of infection. The lowest  concentration of SARS-CoV-2 viral copies this assay can detect is 250  copies /  mL. A negative result does not preclude SARS-CoV-2 infection  and should not be used as the sole basis for treatment or other  patient management decisions.  A negative result may occur with  improper specimen collection / handling, submission of specimen other  than nasopharyngeal swab, presence of viral mutation(s) within the  areas targeted by this assay, and inadequate number of viral copies  (<250 copies / mL). A negative result must be combined with clinical  observations, patient history, and epidemiological information. If result is POSITIVE SARS-CoV-2 target nucleic acids are DETECTED. Th e SARS-CoV-2 RNA is generally detectable in upper and lower  respiratory specimens during the acute phase of infection.  Positive  results are indicative of active infection with SARS-CoV-2.  Clinical  correlation with patient history and other diagnostic information is  necessary to determine patient infection status.  Positive results do  not rule out bacterial infection or co-infection with other viruses. If result is PRESUMPTIVE POSTIVE SARS-CoV-2 nucleic acids MAY BE PRESENT.   A presumptive positive result was obtained on the submitted specimen  and confirmed on repeat testing.  While 2019 novel coronavirus  (SARS-CoV-2) nucleic acids may be present in the submitted sample  additional confirmatory testing may be necessary for epidemiological  and / or clinical management purposes  to differentiate between  SARS-CoV-2 and other Sarbecovirus currently known to infect humans.  If clinically indicated additional testing with an alternate test  methodology [phone] is  advised. The SARS-CoV-2 RNA is generally  detectable in upper and lower respiratory specimens during the acute  phase of infection. The expected result is Negative. Fact Sheet for Patients:  BoilerBrush.com.cyhttps://www.fda.gov/media/136312/download Fact Sheet for Healthcare Providers: https://pope.com/https://www.fda.gov/media/136313/download This test is  not yet approved or cleared by the [place] FDA and has been authorized for detection and/or diagnosis of SARS-CoV-2 by FDA under an Emergency Use Authorization (EUA).  This EUA will remain in effect (meaning this test can be used) for the duration of the COVID-19 declaration under Section 564(b)(1) of the Act, 21 U.S.C. section 360bbb-3(b)(1), unless the authorization is terminated or revoked sooner. Performed at [facility], [address] [place], [place] [address]      Medications:   . methylPREDNISolone (SOLU-MEDROL) injection  60 mg Intravenous Q24H   Continuous Infusions: . azithromycin Stopped (10/17/18 2146)  . heparin 1,250 Units/hr (10/17/18 1149)    LOS: 2 days   Marinda ElkAbraham Feliz Ortiz  [facility]  10/18/2018, 6:57 AM

## 2018-10-18 NOTE — Progress Notes (Signed)
Patient's daughter Barry Brunner was called and updated on patient's condition. Daughter was very happy that her father was up and walking around today. Was given emotional support.

## 2018-10-18 NOTE — Care Management Important Message (Signed)
Important Message  Patient Details  Name: Torez Beauregard MRN: [id number] Date of Birth: 08/15/1955   Medicare Important Message Given:  Yes - Important Message mailed due to current National Emergency   Verbal consent obtained due to current National Emergency  Relationship to patient: Child Contact Name: Richardson Chiquito Call Date: 10/18/18  Time: 1627 Phone: [phone] Outcome: Spoke with contact Important Message mailed to: Emergency contact on file      [facility] 10/18/2018, 4:27 PM

## 2018-10-18 NOTE — Progress Notes (Signed)
Report received from [employer], Therapist, sports.  When we entered the room the pump was alarming.  RN giving report was unsure how long pump had been alarming.  Rn Stated she rounded on him approx 1750 and pump was not alarming.  Pump restarted at this time.

## 2018-10-18 NOTE — Progress Notes (Signed)
Daughter Barry Brunner updated on pt.

## 2018-10-18 NOTE — Progress Notes (Signed)
Patient ambulated 5 laps around unit with saturations of 90%

## 2018-10-18 NOTE — Progress Notes (Signed)
[place] for heparin Indication: thrombus in ascending aorta  No Known Allergies  Patient Measurements: Height: 5\' 3"  (160 cm) Weight: 155 lb (70.3 kg) IBW/kg (Calculated) : 56.9 Heparin Dosing Weight: 70 kg  Vital Signs: Temp: 98 F (36.7 C) (07/03 0613) Temp Source: Oral (07/03 0613) BP: 108/92 (07/03 5176) Pulse Rate: 94 (07/03 0613)  Labs: Recent Labs    10/15/18 2030 10/16/18 0248 10/16/18 0336  10/16/18 1231 10/17/18 0443 10/18/18 0500  HGB 14.3  --   --   --   --  13.8  --   HCT 42.9  --   --   --   --  41.1  --   PLT 211  --   --   --   --  231  --   HEPARINUNFRC  --   --   --    < > 0.59 0.48 0.20*  CREATININE 0.90  --   --   --   --  0.86  --   TROPONINIHS  --  4 4  --   --   --   --    < > = values in this interval not displayed.    Estimated Creatinine Clearance: 75.5 mL/min (by C-G formula based on SCr of 0.86 mg/dL).  Assessment: 63 yo man with recent dx COVID-19 presents with SOB. 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 Pharmacy consulted for  heparin. No surgery per vascular.  Heparin level down to subtherapeutic (0.2) this morning. RN reports that pt did have some alarming overnight as he kept bending the arm and the line was occluding. CBC stable  Goal of Therapy:  Heparin level 0.3-0.7 units/ml Monitor platelets by anticoagulation protocol: Yes   Plan:  Increase heparin drip to 1350 units/hr Will f/u heparin level in 6 hours  Sherlon Handing, PharmD, BCPS Clinical pharmacist 10/18/2018 6:50 AM

## 2018-10-18 NOTE — Progress Notes (Addendum)
Pt sleeping.  No s/sx of distress,  CLWR.  0437  Pt sleeping.  No s/sx of distress.  CLWR

## 2018-10-18 NOTE — Progress Notes (Signed)
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  No Known Allergies  Patient Measurements: Height: 5\' 3"  (160 cm) Weight: 155 lb (70.3 kg) IBW/kg (Calculated) : 56.9 Heparin Dosing Weight: 70 kg  Vital Signs: Temp: 98.1 F (36.7 C) (07/03 0857) Temp Source: Oral (07/03 0731) BP: 127/96 (07/03 0857) Pulse Rate: 108 (07/03 0955)  Labs: Recent Labs    10/15/18 2030 10/16/18 0248 10/16/18 0336  10/16/18 1231 10/17/18 0443 10/18/18 0500  HGB 14.3  --   --   --   --  13.8 13.9  HCT 42.9  --   --   --   --  41.1 43.2  PLT 211  --   --   --   --  231 213  HEPARINUNFRC  --   --   --    < > 0.59 0.48 0.20*  CREATININE 0.90  --   --   --   --  0.86  --   TROPONINIHS  --  4 4  --   --   --   --    < > = values in this interval not displayed.    Estimated Creatinine Clearance: 75.5 mL/min (by C-G formula based on SCr of 0.86 mg/dL).   Medications:  Infusions:  . azithromycin Stopped (10/17/18 2146)  . heparin 1,350 Units/hr (10/18/18 1052)    Assessment: 63 yo man with recent dx COVID-19 presents with SOB. 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 Pharmacy consulted for IV heparin. No surgery per vascular.  Today, 10/18/2018:  Heparin level 0.36, therapeutic on heparin at 1350 units/hr  CBC: Hgb and Plt stable, WNL.  No bleeding or complications reported.  RN reports that pt did have some alarming overnight as he kept bending the arm and the line was occluding.  Today IV line has been working well without alarms or interruptions.  Goal of Therapy:  Heparin level 0.3-0.7 units/ml Monitor platelets by anticoagulation protocol: Yes   Plan:  Continue heparin IV infusion at 1350 units/hr 6 hour confirmatory heparin level. Daily heparin level and CBC Monitor for s/s bleeding.   Gretta Arab PharmD, BCPS Clinical pharmacist phone 7am- 5pm: [phone] 10/18/2018 12:12 PM

## 2018-10-19 LAB — CBC
HCT: 41.8 % (ref 39.0–52.0)
Hemoglobin: 13.7 g/dL (ref 13.0–17.0)
MCH: 30.7 pg (ref 26.0–34.0)
MCHC: 32.8 g/dL (ref 30.0–36.0)
MCV: 93.7 fL (ref 80.0–100.0)
Platelets: 211 10*3/uL (ref 150–400)
RBC: 4.46 MIL/uL (ref 4.22–5.81)
RDW: 13.2 % (ref 11.5–15.5)
WBC: 7.1 10*3/uL (ref 4.0–10.5)
nRBC: 0 % (ref 0.0–0.2)

## 2018-10-19 LAB — PROTEIN S ACTIVITY: Protein S Activity: 64 % (ref 63–140)

## 2018-10-19 LAB — PROTEIN C ACTIVITY: Protein C Activity: 169 % (ref 73–180)

## 2018-10-19 LAB — HEPARIN LEVEL (UNFRACTIONATED)
Heparin Unfractionated: <0.1 IU/mL — ABNORMAL LOW (ref 0.30–0.70)
Heparin Unfractionated: <0.1 IU/mL — ABNORMAL LOW (ref 0.30–0.70)

## 2018-10-19 LAB — PROTEIN S, TOTAL: Protein S Ag, Total: 93 % (ref 60–150)

## 2018-10-19 MED ORDER — APIXABAN 5 MG PO TABS
10.0000 mg | ORAL_TABLET | Freq: Two times a day (BID) | ORAL | Status: DC
  Administered 2018-10-19 – 2018-10-20 (×3): 10 mg via ORAL
  Filled 2018-10-19 (×3): qty 2

## 2018-10-19 MED ORDER — HEPARIN BOLUS VIA INFUSION
2000.0000 [IU] | INTRAVENOUS | Status: DC
  Filled 2018-10-19: qty 2000

## 2018-10-19 MED ORDER — HEPARIN (PORCINE) 25000 UT/250ML-% IV SOLN: 1550.0000 [IU]/h | INTRAVENOUS | Status: DC

## 2018-10-19 MED ORDER — METOPROLOL TARTRATE 5 MG/5ML IV SOLN: 5.0000 mg | Freq: Four times a day (QID) | INTRAVENOUS | Status: DC | PRN

## 2018-10-19 MED ORDER — METOPROLOL TARTRATE 25 MG PO TABS
25.0000 mg | ORAL_TABLET | Freq: Two times a day (BID) | ORAL | Status: DC
  Administered 2018-10-19 – 2018-10-20 (×3): 25 mg via ORAL
  Filled 2018-10-19 (×3): qty 1

## 2018-10-19 MED ORDER — APIXABAN 5 MG PO TABS: 5.0000 mg | ORAL_TABLET | Freq: Two times a day (BID) | ORAL | Status: DC

## 2018-10-19 NOTE — Discharge Instructions (Addendum)
Informacin sobre mi medicina - ELIQUIS (apixaban) POR QU SE LE RECET ELIQUIS ? Eliquis se le recet para tratar los cogulos de sangre que pueden haber sido encontrados en las venas de las piernas (trombosis venosa profunda) o en los pulmones (embolia pulmonar) y para reducir el riesgo de que se vuelva a producir otra vez.  QU NECESITA SABER SOBRE ELIQUIS ? La dosis inicial es de 10 mg (dos pastillas de 5 mg) tomadas DOS VECES diariamente por los PRIMEROS SIETE (7) DAS, luego el 10/26/18 la dosis se reduce a UNA pastilla de 5 mg tomada DOS veces al [employer]. Eliquis puede tomarse con o sin comida.  Trate de tomar la dosis casi a la misma hora por la maana y por la noche. Si tiene dificultad para tragar la pastilla entera por favor hable con su farmacutico acerca de cmo tomar el medicamento de forma segura.  Tome Eliquis exactamente segn las indicaciones y NO deje de tomar Eliquis sin hablar con el mdico que le recet el medicamento. El dejar de tomarlo puede aumentar su riesgo de Actor un nuevo cogulo de [place]. Vuelva a surtir su receta antes de que se le acabe.  Despus de darle de alta, debe tener citas de control regulares con el proveedor de atencin mdica que le est recentando su Eliquis.  QU HACER SI SE LE OLVIDA TOMAR UNA DOSIS? Si una dosis de ELIQUIS no se toma a Arts administrator, tmela tan pronto como sea posible en el mismo da y [place]. La dosis no debe duplicarse para compensar la dosis olvidada.  INFORMACIN IMPORTANTE DE SEGURIDAD Un posible efecto secundario de Eliquis es el sangrado. Debe llamar a su proveedor de atencin mdica de inmediato si usted experimenta cualquiera de los siguientes sntomas:  Sangrado de una lesin o de la nariz que no cesa de Corporate treasurer.  Color extrao de la orina (rojo o marrn oscuro) o de las heces fecales (rojo o negro).  Moretones raros por [employer].  Una cada grave o si se  golpea la cabeza (incluso si no hay sangrado).  Algunos medicamentos pueden interactuar con Eliquis y podran aumentar su riesgo de sangrado o de coagulacin mientras que est tomando Eliquis. Para ayudar a evitar esto, consulte a su proveedor de atencin mdica o a su farmacutico antes de usar cualquier medicamento nuevo con receta o sin receta, incluyendo hierbas, vitaminas, medicamentos antiinflamatorios (AINEs) y suplementos.  Esta pgina web tiene ms informacin sobre Eliquis (apixaban): [email or web address].

## 2018-10-19 NOTE — Progress Notes (Addendum)
Pt resting in bed.  Denies pain at this time.  No s/sx of distress noted.  CLWR.  0425  Pt sleeping.  No s/sx of distress.  CLWR.

## 2018-10-19 NOTE — Progress Notes (Addendum)
[facility] PROGRESS NOTE    Progress Note  Charles Arias  TKW:[id number] DOB: 1956/01/14 DOA: 10/15/2018 PCP: Patient, No Pcp Per     Brief Narrative:   Charles Arias is an 63 y.o. male past medical history significant for asthma recently discharged from the hospital October 05, 2018 for COVID-19 pneumonia, was doing fairly good when discharged from the hospital but is started having productive cough and shortness of breath in the ED he was found to be slightly tachypneic saturating 100% on room air chest x-ray showed interval progression of airspace opacity, CT angiogram show widespread diffuse interstitial pattern and a 22mm focus of an adherent thrombus in the aorta.  Patient was started on IV heparin ER thoracic has evaluated the patient and his the patient was asymptomatic agreed with anticoagulation  Assessment/Plan:   Pneumonia due to COVID-19 virus: He was saturating 92% on room air on admission and reported some hemoptysis. He has remained afebrile, change azithromycin to oral. No leukocytosis.  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 Spoke with the cardiothoracic surgeon Dr. Roxan Hockey who recommended to switch him over to Eliquis once the patient is able to go home. Discontinue IV heparin and start oral Eliquis.  Sinus tachycardia: Start metoprolol check a 12 lead EKG.  DVT prophylaxis: heparin Family Communication:none Disposition Plan/Barrier to D/C: home on 10/20/2018 Code Status:     Code Status Orders  (From admission, onward)         Start     Ordered   10/16/18 0136  Full code  Continuous     10/16/18 0136        Code Status History    Date Active Date Inactive Code Status Order ID Comments User Context   09/30/2018 0110 10/05/2018 1712 Full Code [id number]  Etta Quill, DO ED   Advance Care Planning Activity        IV Access:    Peripheral IV   Procedures and diagnostic  studies:   No results found.   Medical Consultants:    None.  Anti-Infectives:   azithromycin  Subjective:    Charles Arias relates his breathing is much improved today than yesterday.  Objective:    Vitals:   10/18/18 1654 10/18/18 2041 10/19/18 0400 10/19/18 0827  BP: 113/76 122/70 113/86 128/82  Pulse:  92    Resp:  (!) 23    Temp: 98.2 F (36.8 C) 98.5 F (36.9 C) 97.7 F (36.5 C) 98.5 F (36.9 C)  TempSrc: Oral Oral Oral Oral  SpO2:  94%    Weight:      Height:        Intake/Output Summary (Last 24 hours) at 10/19/2018 0829 Last data filed at 10/19/2018 0600 Gross per 24 hour  Intake 1130 ml  Output 1360 ml  Net -230 ml   Filed Weights   10/15/18 2300  Weight: 70.3 kg    Exam: General exam: In no acute distress. Respiratory system: Good air movement and crackles bilaterally. Cardiovascular system: S1 & S2 heard, RRR. No JVD. Gastrointestinal system: Abdomen is nondistended, soft and nontender.  Central nervous system: Alert and oriented. No focal neurological deficits. Extremities: No pedal edema. Skin: No rashes, lesions or ulcers Psychiatry: Judgement and insight appear normal. Mood & affect appropriate.    Data Reviewed:    Labs: Basic Metabolic Panel: Recent Labs  Lab 10/15/18 2030 10/17/18 0443  NA 135 134*  K 4.6 4.3  CL  105 105  CO2 23 22  GLUCOSE 107* 163*  BUN 10 14  CREATININE 0.90 0.86  CALCIUM 8.4* 8.7*   GFR Estimated Creatinine Clearance: 75.5 mL/min (by C-G formula based on SCr of 0.86 mg/dL). Liver Function Tests: Recent Labs  Lab 10/15/18 2030  AST 27  ALT 35  ALKPHOS 57  BILITOT 1.0  PROT 5.4*  ALBUMIN 3.0*   No results for input(s): LIPASE, AMYLASE in the last 168 hours. No results for input(s): AMMONIA in the last 168 hours. Coagulation profile No results for input(s): INR, PROTIME in the last 168 hours. COVID-19 Labs  No results for input(s): DDIMER, FERRITIN, LDH, CRP in the last 72 hours.  Lab  Results  Component Value Date   SARSCOV2NAA POSITIVE (A) 10/16/2018   SARSCOV2NAA DETECTED (A) 09/29/2018    CBC: Recent Labs  Lab 10/15/18 2030 10/17/18 0443 10/18/18 0500 10/19/18 0352  WBC 7.3 11.7* 7.4 7.1  NEUTROABS 4.4  --   --   --   HGB 14.3 13.8 13.9 13.7  HCT 42.9 41.1 43.2 41.8  MCV 93.5 91.3 94.7 93.7  PLT 211 231 213 211   Cardiac Enzymes: No results for input(s): CKTOTAL, CKMB, CKMBINDEX, TROPONINI in the last 168 hours. BNP (last 3 results) No results for input(s): PROBNP in the last 8760 hours. CBG: No results for input(s): GLUCAP in the last 168 hours. D-Dimer: No results for input(s): DDIMER in the last 72 hours. Hgb A1c: No results for input(s): HGBA1C in the last 72 hours. Lipid Profile: No results for input(s): CHOL, HDL, LDLCALC, TRIG, CHOLHDL, LDLDIRECT in the last 72 hours. Thyroid function studies: No results for input(s): TSH, T4TOTAL, T3FREE, THYROIDAB in the last 72 hours.  Invalid input(s): FREET3 Anemia work up: No results for input(s): VITAMINB12, FOLATE, FERRITIN, TIBC, IRON, RETICCTPCT in the last 72 hours. Sepsis Labs: Recent Labs  Lab 10/15/18 2030 10/16/18 0248 10/17/18 0443 10/18/18 0500 10/19/18 0352  PROCALCITON  --  <0.10  --   --   --   WBC 7.3  --  11.7* 7.4 7.1   Microbiology Recent Results (from the past 240 hour(s))  SARS Coronavirus 2 (CEPHEID- Performed [facility]), Hosp Order     Status: Abnormal   Collection Time: 10/16/18 12:24 AM   Specimen: Nasopharyngeal Swab  Result Value Ref Range Status   SARS Coronavirus 2 POSITIVE (A) NEGATIVE Final    Comment: RESULT CALLED TO, READ BACK BY AND VERIFIED WITH: RN T JORDEN @ 10/16/18 BY S GEZAHEGN (NOTE) If result is NEGATIVE SARS-CoV-2 target nucleic acids are NOT DETECTED. The SARS-CoV-2 RNA is generally detectable in upper and lower  respiratory specimens during the acute phase of infection. The lowest  concentration of SARS-CoV-2 viral copies  this assay can detect is 250  copies / mL. A negative result does not preclude SARS-CoV-2 infection  and should not be used as the sole basis for treatment or other  patient management decisions.  A negative result may occur with  improper specimen collection / handling, submission of specimen other  than nasopharyngeal swab, presence of viral mutation(s) within the  areas targeted by this assay, and inadequate number of viral copies  (<250 copies / mL). A negative result must be combined with clinical  observations, patient history, and epidemiological information. If result is POSITIVE SARS-CoV-2 target nucleic acids are DETECTED. Th e SARS-CoV-2 RNA is generally detectable in upper and lower  respiratory specimens during the acute phase of infection.  Positive  results are indicative of active infection with SARS-CoV-2.  Clinical  correlation with patient history and other diagnostic information is  necessary to determine patient infection status.  Positive results do  not rule out bacterial infection or co-infection with other viruses. If result is PRESUMPTIVE POSTIVE SARS-CoV-2 nucleic acids MAY BE PRESENT.   A presumptive positive result was obtained on the submitted specimen  and confirmed on repeat testing.  While 2019 novel coronavirus  (SARS-CoV-2) nucleic acids may be present in the submitted sample  additional confirmatory testing may be necessary for epidemiological  and / or clinical management purposes  to differentiate between  SARS-CoV-2 and other Sarbecovirus currently known to infect humans.  If clinically indicated additional testing with an alternate test  methodology [phone] is  advised. The SARS-CoV-2 RNA is generally  detectable in upper and lower respiratory specimens during the acute  phase of infection. The expected result is Negative. Fact Sheet for Patients:  BoilerBrush.com.cyhttps://www.fda.gov/media/136312/download Fact Sheet for Healthcare Providers:  https://pope.com/https://www.fda.gov/media/136313/download This test is not yet approved or cleared by the [place] FDA and has been authorized for detection and/or diagnosis of SARS-CoV-2 by FDA under an Emergency Use Authorization (EUA).  This EUA will remain in effect (meaning this test can be used) for the duration of the COVID-19 declaration under Section 564(b)(1) of the Act, 21 U.S.C. section 360bbb-3(b)(1), unless the authorization is terminated or revoked sooner. Performed at [facility], [address] [place], [place] [address]      Medications:   . heparin  2,000 Units Intravenous STAT  . methylPREDNISolone (SOLU-MEDROL) injection  60 mg Intravenous Q24H   Continuous Infusions: . azithromycin Stopped (10/18/18 2137)  . heparin      LOS: 3 days   Marinda Elkbraham Feliz Ortiz  [facility]  10/19/2018, 8:29 AM

## 2018-10-19 NOTE — Plan of Care (Signed)
Patient sitting up in bed; no complaints at this time. Pain controlled. Will continue to monitor.

## 2018-10-19 NOTE — Progress Notes (Signed)
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  No Known Allergies  Patient Measurements: Height: 5\' 3"  (160 cm) Weight: 155 lb (70.3 kg) IBW/kg (Calculated) : 56.9 Heparin Dosing Weight: 70 kg  Vital Signs: Temp: 97.7 F (36.5 C) (07/04 0400) Temp Source: Oral (07/04 0400) BP: 113/86 (07/04 0400) Pulse Rate: 92 10-30-22 2041)  Labs: Recent Labs    10/17/18 0443 10/30/2018 0500 10-30-18 1250 10/19/18 0352 10/19/18 0400  HGB 13.8 13.9  --  13.7  --   HCT 41.1 43.2  --  41.8  --   PLT 231 213  --  211  --   HEPARINUNFRC 0.48 0.20* 0.36  --  <0.10*  CREATININE 0.86  --   --   --   --     Estimated Creatinine Clearance: 75.5 mL/min (by C-G formula based on SCr of 0.86 mg/dL).   Medications:  Infusions:  . azithromycin Stopped (October 30, 2018 2137)  . heparin 1,350 Units/hr (10/19/18 0600)    Assessment: 63 yo man with recent dx COVID-19 presents with SOB. 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 Pharmacy consulted for IV heparin. No surgery per vascular.  Today, 10/19/2018:  Heparin level <0.1, decreased to sub-therapeutic on heparin at 1350 units/hr.  RN reports pump was off (battery may have died?) on 2022-10-30 PM from 1700-1900.  RN reports that pump and IV tubing have been working correctly overnight.  CBC: Hgb and Plt stable, WNL.  No bleeding or complications reported.  Goal of Therapy:  Heparin level 0.3-0.7 units/ml Monitor platelets by anticoagulation protocol: Yes   Plan:  Give heparin 2000 units bolus IV x 1 Increase to heparin IV infusion at 1550 units/hr Heparin level 6 hours after rate change Daily heparin level and CBC. Monitor for IV infusion issues, s/s bleeding.   Gretta Arab PharmD, BCPS Clinical pharmacist phone 7am- 5pm: [phone] 10/19/2018 7:54 AM

## 2018-10-20 LAB — CBC
HCT: 43.2 % (ref 39.0–52.0)
Hemoglobin: 14.1 g/dL (ref 13.0–17.0)
MCH: 30.6 pg (ref 26.0–34.0)
MCHC: 32.6 g/dL (ref 30.0–36.0)
MCV: 93.7 fL (ref 80.0–100.0)
Platelets: 198 10*3/uL (ref 150–400)
RBC: 4.61 MIL/uL (ref 4.22–5.81)
RDW: 13.2 % (ref 11.5–15.5)
WBC: 7.6 10*3/uL (ref 4.0–10.5)
nRBC: 0 % (ref 0.0–0.2)

## 2018-10-20 LAB — CARDIOLIPIN ANTIBODIES, IGG, IGM, IGA
Anticardiolipin IgA: <9 APL U/mL (ref 0–11)
Anticardiolipin IgG: <9 GPL U/mL (ref 0–14)
Anticardiolipin IgM: 9 MPL U/mL (ref 0–12)

## 2018-10-20 LAB — BETA-2-GLYCOPROTEIN I ABS, IGG/M/A
Beta-2 Glyco I IgG: <9 GPI IgG units (ref 0–20)
Beta-2-Glycoprotein I IgA: <9 GPI IgA units (ref 0–25)
Beta-2-Glycoprotein I IgM: <9 GPI IgM units (ref 0–32)

## 2018-10-20 MED ORDER — APIXABAN 5 MG PO TABS: 10.0000 mg | ORAL_TABLET | Freq: Two times a day (BID) | ORAL | 0 refills | Status: DC

## 2018-10-20 MED ORDER — AZITHROMYCIN 500 MG PO TABS: 500.0000 mg | ORAL_TABLET | Freq: Every day | ORAL | 0 refills | Status: AC

## 2018-10-20 MED ORDER — APIXABAN 5 MG PO TABS: 5.0000 mg | ORAL_TABLET | Freq: Two times a day (BID) | ORAL | 3 refills | Status: DC

## 2018-10-20 MED ORDER — METOPROLOL TARTRATE 25 MG PO TABS: 25.0000 mg | ORAL_TABLET | Freq: Two times a day (BID) | ORAL | 3 refills | Status: DC

## 2018-10-20 NOTE — Plan of Care (Signed)

## 2018-10-20 NOTE — TOC Transition Note (Signed)
transition of Care [facility]) - CM/SW Discharge Note   Patient Details  Name: Charles Arias MRN: [id number] Date of Birth: 1955/09/23  [facility] CM/SW Contact:  Ninfa Meeker, RN Phone Number: [phone] (Working remotely) 10/20/2018, 11:55 AM   Clinical Narrative:  63 yr old gentlman admitted and treated for COVID 19 and aortic thrombus. Patient will discharge home today with family. Case manager contacted patient's daughter-[name] [phone] and discussed discharge. Patient will be scheduled for televisit on next business day, 10/21/18, and CM will also contact Cardio thoracic Surgery to arrange a follow up visit. Patient will go home on Eliquis. CM called 30 day free trial card information into the [place] [facility] on [address] [place]. AJO:[id number], PCN:[id number], HMCNO:[id number],OQ:[id number].  Patient's daughter and son will pick him up from [facility].     Final next level of care: Home/Self Care Barriers to Discharge: No Barriers Identified   Patient Goals and CMS Choice        Discharge Placement                       Discharge Plan and Services   Discharge Planning Services: CM Consult            DME Arranged: N/A         HH Arranged: NA          Social Determinants of Health (SDOH) Interventions     Readmission Risk Interventions No flowsheet data found.

## 2018-10-20 NOTE — Progress Notes (Signed)
AVS instructions given to pt and daughter. Both verbalize understanding. CM to follow up with pt and family tomorrow about follow up appts. Scripts sent with pt.

## 2018-10-20 NOTE — Discharge Summary (Signed)
Physician Discharge Summary  Charles Arias GNF:[id number] DOB: 05-31-1955 DOA: 10/15/2018  PCP: Patient, No Pcp Per  Admit date: 10/15/2018 Discharge date: 10/20/2018  Admitted From: Home Disposition:  Home  Recommendations for Outpatient Follow-up:  1. Follow up with PCP in 1-2 weeks 2. Please obtain BMP/CBC in one week. 3. Please obtain a hypercoagulable panel as an outpatient   Home Health:No Equipment/Devices:None  Discharge Condition:Stable CODE STATUS:Full Diet recommendation: Heart Healthy  Brief/Interim Summary: 63 y.o. male past medical history significant for asthma recently discharged from the hospital October 05, 2018 for COVID-19 pneumonia, was doing fairly good when discharged from the hospital but is started having productive cough and shortness of breath in the ED he was found to be slightly tachypneic saturating 100% on room air chest x-ray showed interval progression of airspace opacity, CT angiogram show widespread diffuse interstitial pattern and a 16mm focus of an adherent thrombus in the aorta.   Discharge Diagnoses:  Principal Problem:   Pneumonia due to COVID-19 virus Active Problems:   Aortic thrombus (HCC)   Asthma Pneumonia due to COVID-19 virus: He remained saturating greater than 92% on room air at admission he denied any hemoptysis he remained afebrile. On admission he was started on IV azithromycin which she will continue as an outpatient for total 7 days.  Aortic thrombus: Incidental finding on CT Angie of the chest he was started on IV heparin on admission, it was discussed with the cardiothoracic surgeon recommended switch him over to Eliquis which she will continue as an outpatient and follow-up with thoracic surgery. Hypercoagulable panel was done did not show a deficiency this will have to be repeated as an outpatient.  Sinus tachycardia: Twelve-lead EKG was done that showed him, He was started low-dose metoprolol and his heart rate remained  stable.   Discharge Instructions  Discharge Instructions    Diet - low sodium heart healthy   Complete by: As directed    Increase activity slowly   Complete by: As directed      Allergies as of 10/20/2018   No Known Allergies     Medication List    TAKE these medications   acetaminophen 500 MG tablet Commonly known as: TYLENOL Take 1,000 mg by mouth every 6 (six) hours as needed for fever or headache (pain).   albuterol 108 (90 Base) MCG/ACT inhaler Commonly known as: VENTOLIN HFA Inhale 2 puffs into the lungs every 6 (six) hours as needed for wheezing or shortness of breath.   apixaban 5 MG Tabs tablet Commonly known as: ELIQUIS Take 2 tablets (10 mg total) by mouth 2 (two) times daily.   apixaban 5 MG Tabs tablet Commonly known as: ELIQUIS Take 1 tablet (5 mg total) by mouth 2 (two) times daily. Start taking on: October 26, 2018   azithromycin 500 MG tablet Commonly known as: Zithromax Take 1 tablet (500 mg total) by mouth daily for 3 days. Take 1 tablet daily for 3 days.   benzonatate 100 MG capsule Commonly known as: TESSALON Take 200 mg by mouth 3 (three) times daily as needed for cough.   metoprolol tartrate 25 MG tablet Commonly known as: LOPRESSOR Take 1 tablet (25 mg total) by mouth 2 (two) times daily.       No Known Allergies  Consultations:  None   Procedures/Studies: Ct Angio Chest Pe W/cm &/or Wo Cm  Result Date: 10/15/2018 CLINICAL DATA:  63 year old male positive COVID-19. Shortness of breath since yesterday. EXAM: CT ANGIOGRAPHY CHEST WITH CONTRAST TECHNIQUE: Multidetector  CT imaging of the chest was performed using the standard protocol during bolus administration of intravenous contrast. Multiplanar CT image reconstructions and MIPs were obtained to evaluate the vascular anatomy. CONTRAST:  42mL OMNIPAQUE IOHEXOL 350 MG/ML SOLN COMPARISON:  Portable chest earlier today. FINDINGS: Cardiovascular: Good contrast bolus timing in the pulmonary  arterial tree. No focal filling defect identified in the pulmonary arteries to suggest acute pulmonary embolism. However, there is a 16 millimeter area of adherent thrombus in the distal ascending aorta, proximal arch (series 6, image 89, series 5, image 31. The visible proximal great vessels appear to remain patent. Negative visible aorta otherwise. Cardiac size at the upper limits of normal. No pericardial effusion. Mediastinum/Nodes: Borderline to mild reactive mediastinal and bilateral hilar lymph nodes. Lungs/Pleura: Widespread bilateral ground-glass and more confluent peribronchial opacity affecting all lobes. Areas in the superior segments of the lower lobes and posterior right upper lobe are bordering on consolidation. Major airways are patent. No pleural effusion. Upper Abdomen: Negative visible liver, gallbladder, spleen, pancreas, adrenal glands and bowel. Partially visible left renal cysts. Musculoskeletal: No acute osseous abnormality identified. Review of the MIP images confirms the above findings. IMPRESSION: 1. Positive for a 16 mm focus of adherent thrombus in the aorta (distal ascending/proximal arch - see series 5, image 31). The proximal great vessels appear to remain patent. 2. Negative for acute pulmonary embolus. 3. Widespread bilateral COVID-19 pneumonia.  No pleural effusion. 4. Negative visible upper abdomen. Study discussed by telephone with the ED provider on 10/15/2018 at 23:16 . Electronically Signed   By: Genevie Ann M.D.   On: 10/15/2018 23:22   Dg Chest Port 1 View  Result Date: 10/15/2018 CLINICAL DATA:  Covid19 positive EXAM: PORTABLE CHEST 1 VIEW COMPARISON:  09/29/2018 FINDINGS: Mild cardiac enlargement. Patchy bilateral mid and lower lung zone predominant interstitial and airspace opacities are again noted and appear increased from previous exam. IMPRESSION: 1. Interval progression of bilateral interstitial and airspace opacities. Electronically Signed   By: Kerby Moors M.D.    On: 10/15/2018 20:42   Dg Chest Portable 1 View  Result Date: 09/29/2018 CLINICAL DATA:  Shortness of breath, chest pain EXAM: PORTABLE CHEST 1 VIEW COMPARISON:  None. FINDINGS: Heart is upper limits normal in size. Suspect patchy bilateral airspace opacities. No effusions. No acute bony abnormality. IMPRESSION: Patchy bilateral airspace opacities concerning for pneumonia. Electronically Signed   By: Rolm Baptise M.D.   On: 09/29/2018 21:35     Subjective: Complaints feels great.  Discharge Exam: Vitals:   10/20/18 0411 10/20/18 0855  BP: 108/76 128/76  Pulse: 90 85  Resp: 20 (!) 25  Temp: 98.5 F (36.9 C) 98.7 F (37.1 C)  SpO2: 95% 94%   Vitals:   10/19/18 1200 10/19/18 1956 10/20/18 0411 10/20/18 0855  BP:  118/78 108/76 128/76  Pulse: (!) 107 91 90 85  Resp: (!) 22 20 20  (!) 25  Temp:  98.8 F (37.1 C) 98.5 F (36.9 C) 98.7 F (37.1 C)  TempSrc:  Oral Oral Oral  SpO2: 95% 95% 95% 94%  Weight:      Height:        General: Pt is alert, awake, not in acute distress Cardiovascular: RRR, S1/S2 +, no rubs, no gallops Respiratory: CTA bilaterally, no wheezing, no rhonchi Abdominal: Soft, NT, ND, bowel sounds + Extremities: no edema, no cyanosis    The results of significant diagnostics from this hospitalization (including imaging, microbiology, ancillary and laboratory) are listed below for reference.  Microbiology: Recent Results (from the past 240 hour(s))  SARS Coronavirus 2 (CEPHEID- Performed in [facility] hospital lab), Hosp Order     Status: Abnormal   Collection Time: 10/16/18 12:24 AM   Specimen: Nasopharyngeal Swab  Result Value Ref Range Status   SARS Coronavirus 2 POSITIVE (A) NEGATIVE Final    Comment: RESULT CALLED TO, READ BACK BY AND VERIFIED WITH: RN T JORDEN @ 10/16/18 BY S GEZAHEGN (NOTE) If result is NEGATIVE SARS-CoV-2 target nucleic acids are NOT DETECTED. The SARS-CoV-2 RNA is generally detectable in upper and lower  respiratory  specimens during the acute phase of infection. The lowest  concentration of SARS-CoV-2 viral copies this assay can detect is 250  copies / mL. A negative result does not preclude SARS-CoV-2 infection  and should not be used as the sole basis for treatment or other  patient management decisions.  A negative result may occur with  improper specimen collection / handling, submission of specimen other  than nasopharyngeal swab, presence of viral mutation(s) within the  areas targeted by this assay, and inadequate number of viral copies  (<250 copies / mL). A negative result must be combined with clinical  observations, patient history, and epidemiological information. If result is POSITIVE SARS-CoV-2 target nucleic acids are DETECTED. Th e SARS-CoV-2 RNA is generally detectable in upper and lower  respiratory specimens during the acute phase of infection.  Positive  results are indicative of active infection with SARS-CoV-2.  Clinical  correlation with patient history and other diagnostic information is  necessary to determine patient infection status.  Positive results do  not rule out bacterial infection or co-infection with other viruses. If result is PRESUMPTIVE POSTIVE SARS-CoV-2 nucleic acids MAY BE PRESENT.   A presumptive positive result was obtained on the submitted specimen  and confirmed on repeat testing.  While 2019 novel coronavirus  (SARS-CoV-2) nucleic acids may be present in the submitted sample  additional confirmatory testing may be necessary for epidemiological  and / or clinical management purposes  to differentiate between  SARS-CoV-2 and other Sarbecovirus currently known to infect humans.  If clinically indicated additional testing with an alternate test  methodology [phone]) is  advised. The SARS-CoV-2 RNA is generally  detectable in upper and lower respiratory specimens during the acute  phase of infection. The expected result is Negative. Fact Sheet for  Patients:  BoilerBrush.com.cy Fact Sheet for Healthcare Providers: https://pope.com/ This test is not yet approved or cleared by the [place] FDA and has been authorized for detection and/or diagnosis of SARS-CoV-2 by FDA under an Emergency Use Authorization (EUA).  This EUA will remain in effect (meaning this test can be used) for the duration of the COVID-19 declaration under Section 564(b)(1) of the Act, 21 U.S.C. section 360bbb-3(b)(1), unless the authorization is terminated or revoked sooner. Performed at [facility], [address], [place], [place] [address]      Labs: BNP (last 3 results) No results for input(s): BNP in the last 8760 hours. Basic Metabolic Panel: Recent Labs  Lab 10/15/18 2030 10/17/18 0443  NA 135 134*  K 4.6 4.3  CL 105 105  CO2 23 22  GLUCOSE 107* 163*  BUN 10 14  CREATININE 0.90 0.86  CALCIUM 8.4* 8.7*   Liver Function Tests: Recent Labs  Lab 10/15/18 2030  AST 27  ALT 35  ALKPHOS 57  BILITOT 1.0  PROT 5.4*  ALBUMIN 3.0*   No results for input(s): LIPASE, AMYLASE in the last 168  hours. No results for input(s): AMMONIA in the last 168 hours. CBC: Recent Labs  Lab 10/15/18 2030 10/17/18 0443 10/18/18 0500 10/19/18 0352 10/20/18 0427  WBC 7.3 11.7* 7.4 7.1 7.6  NEUTROABS 4.4  --   --   --   --   HGB 14.3 13.8 13.9 13.7 14.1  HCT 42.9 41.1 43.2 41.8 43.2  MCV 93.5 91.3 94.7 93.7 93.7  PLT 211 231 213 211 198   Cardiac Enzymes: No results for input(s): CKTOTAL, CKMB, CKMBINDEX, TROPONINI in the last 168 hours. BNP: Invalid input(s): POCBNP CBG: No results for input(s): GLUCAP in the last 168 hours. D-Dimer No results for input(s): DDIMER in the last 72 hours. Hgb A1c No results for input(s): HGBA1C in the last 72 hours. Lipid Profile No results for input(s): CHOL, HDL, LDLCALC, TRIG, CHOLHDL, LDLDIRECT in the last 72 hours. Thyroid function studies No results for  input(s): TSH, T4TOTAL, T3FREE, THYROIDAB in the last 72 hours.  Invalid input(s): FREET3 Anemia work up No results for input(s): VITAMINB12, FOLATE, FERRITIN, TIBC, IRON, RETICCTPCT in the last 72 hours. Urinalysis    Component Value Date/Time   COLORURINE YELLOW 09/30/2018 0239   APPEARANCEUR CLEAR 09/30/2018 0239   LABSPEC 1.016 09/30/2018 0239   PHURINE 6.0 09/30/2018 0239   GLUCOSEU NEGATIVE 09/30/2018 0239   HGBUR NEGATIVE 09/30/2018 0239   BILIRUBINUR NEGATIVE 09/30/2018 0239   KETONESUR NEGATIVE 09/30/2018 0239   PROTEINUR 30 (A) 09/30/2018 0239   NITRITE NEGATIVE 09/30/2018 0239   LEUKOCYTESUR NEGATIVE 09/30/2018 0239   Sepsis Labs Invalid input(s): PROCALCITONIN,  WBC,  LACTICIDVEN Microbiology Recent Results (from the past 240 hour(s))  SARS Coronavirus 2 (CEPHEID- Performed in [facility] lab), Hosp Order     Status: Abnormal   Collection Time: 10/16/18 12:24 AM   Specimen: Nasopharyngeal Swab  Result Value Ref Range Status   SARS Coronavirus 2 POSITIVE (A) NEGATIVE Final    Comment: RESULT CALLED TO, READ BACK BY AND VERIFIED WITH: RN T JORDEN @ 10/16/18 BY S GEZAHEGN (NOTE) If result is NEGATIVE SARS-CoV-2 target nucleic acids are NOT DETECTED. The SARS-CoV-2 RNA is generally detectable in upper and lower  respiratory specimens during the acute phase of infection. The lowest  concentration of SARS-CoV-2 viral copies this assay can detect is 250  copies / mL. A negative result does not preclude SARS-CoV-2 infection  and should not be used as the sole basis for treatment or other  patient management decisions.  A negative result may occur with  improper specimen collection / handling, submission of specimen other  than nasopharyngeal swab, presence of viral mutation(s) within the  areas targeted by this assay, and inadequate number of viral copies  (<250 copies / mL). A negative result must be combined with clinical  observations, patient history, and  epidemiological information. If result is POSITIVE SARS-CoV-2 target nucleic acids are DETECTED. Th e SARS-CoV-2 RNA is generally detectable in upper and lower  respiratory specimens during the acute phase of infection.  Positive  results are indicative of active infection with SARS-CoV-2.  Clinical  correlation with patient history and other diagnostic information is  necessary to determine patient infection status.  Positive results do  not rule out bacterial infection or co-infection with other viruses. If result is PRESUMPTIVE POSTIVE SARS-CoV-2 nucleic acids MAY BE PRESENT.   A presumptive positive result was obtained on the submitted specimen  and confirmed on repeat testing.  While 2019 novel coronavirus  (SARS-CoV-2) nucleic acids may be present in the  submitted sample  additional confirmatory testing may be necessary for epidemiological  and / or clinical management purposes  to differentiate between  SARS-CoV-2 and other Sarbecovirus currently known to infect humans.  If clinically indicated additional testing with an alternate test  methodology [phone]([id number]) is  advised. The SARS-CoV-2 RNA is generally  detectable in upper and lower respiratory specimens during the acute  phase of infection. The expected result is Negative. Fact Sheet for Patients:  BoilerBrush.com.cyhttps://www.fda.gov/media/136312/download Fact Sheet for Healthcare Providers: https://pope.com/https://www.fda.gov/media/136313/download This test is not yet approved or cleared by the [place] FDA and has been authorized for detection and/or diagnosis of SARS-CoV-2 by FDA under an Emergency Use Authorization (EUA).  This EUA will remain in effect (meaning this test can be used) for the duration of the COVID-19 declaration under Section 564(b)(1) of the Act, 21 U.S.C. section 360bbb-3(b)(1), unless the authorization is terminated or revoked sooner. Performed at [facility], [address] [place], [place] [id number]      Time  coordinating discharge: Over 40 minutes  SIGNED:   Marinda ElkAbraham Feliz Ortiz, MD  [facility] 10/20/2018, 9:40 AM Pager   If 7PM-7AM, please contact night-coverage [email or web address] Password [email or web address]

## 2018-10-21 LAB — PROTEIN C, TOTAL: Protein C, Total: 126 % (ref 60–150)

## 2018-10-21 LAB — PTT-LA MIX: PTT-LA Mix: 53.2 s — ABNORMAL HIGH (ref 0.0–48.9)

## 2018-10-21 LAB — LUPUS ANTICOAGULANT PANEL
DRVVT: 38.9 s (ref 0.0–47.0)
PTT Lupus Anticoagulant: 59.2 s — ABNORMAL HIGH (ref 0.0–51.9)

## 2018-10-21 LAB — HEXAGONAL PHASE PHOSPHOLIPID: Hexagonal Phase Phospholipid: 0 s (ref 0–11)

## 2018-10-21 NOTE — Care Management (Signed)
Case manager arranged patient's follow up Televisit with [place] for Tuesday, October 30, 2018 @1010  am. Will contact patient with this information. Case manager was also asked to check with [facility] concerning a follow up appointment. CM spoke with Manuela Schwartz in the office-[phone], after review of Dr. Julien Girt note, it does not appear that he plans to see patient for follow up. Patient is not a surgical candidate per note.     Ricki Miller, RN BSN Case Manager [phone]

## 2018-10-23 LAB — FACTOR 5 LEIDEN

## 2018-10-28 LAB — PROTHROMBIN GENE MUTATION

## 2018-10-29 NOTE — Progress Notes (Signed)
Patient ID: Francie MassingJavier Uribe, male   DOB: 12-10-1955, 63 y.o.   MRN: [id number]  Virtual Visit via Telephone Note  I connected with Francie MassingJavier Uribe on 10/30/18 at 10:10 AM EDT by telephone and verified that I am speaking with the correct person using two identifiers.   I discussed the limitations, risks, security and privacy concerns of performing an evaluation and management service by telephone and the availability of in person appointments. I also discussed with the patient that there may be a patient responsible charge related to this service. The patient expressed understanding and agreed to proceed.  Patient location:  Home  My Location:  [facility] office Persons on the call:  Interpreter Byrd HesselbachMaria, the patient, and me  History of Present Illness: After hospitalization 6/30-10/20/2018 for Covid-19.   He is doing very well.  No SOB.  Minimal/occasional cough.  appetite is good.  No N/V/D.  No f/c.  No one else in family is sick.  He was able to get all meds filled and is taking them as directed.  He says he was not made an appt with cardiothoracic surgery yet.  He denies any CP.    From discharge summary: Recommendations for Outpatient Follow-up:  1. Follow up with PCP in 1-2 weeks 2. Please obtain BMP/CBC in one week. 3. Please obtain a hypercoagulable panel as an outpatient   Home Health:No Equipment/Devices:None  Discharge Condition:Stable CODE STATUS:Full Diet recommendation: Heart Healthy  Brief/Interim Summary: 63 y.o.malepast medical history significant for asthma recently discharged from the hospital October 05, 2018 for COVID-19 pneumonia, was doing fairly good when discharged from the hospital but is started having productive cough and shortness of breath in the ED he was found to be slightly tachypneic saturating 100% on room air chest x-ray showed interval progression of airspace opacity, CT angiogram show widespread diffuse interstitial pattern and a 16mm focus of an adherent thrombus  in the aorta.   Discharge Diagnoses:  Principal Problem:   Pneumonia due to COVID-19 virus Active Problems:   Aortic thrombus (HCC)   Asthma Pneumonia due to COVID-19 virus: He remained saturating greater than 92% on room air at admission he denied any hemoptysis he remained afebrile. On admission he was started on IV azithromycin which she will continue as an outpatient for total 7 days.  Aortic thrombus: Incidental finding on CT Angie of the chest he was started on IV heparin on admission, it was discussed with the cardiothoracic surgeon recommended switch him over to Eliquis which she will continue as an outpatient and follow-up with thoracic surgery. Hypercoagulable panel was done did not show a deficiency this will have to be repeated as an outpatient.  Sinus tachycardia: Twelve-lead EKG was done that showed him, He was started low-dose metoprolol and his heart rate remained stable.    Observations/Objective:  A&Ox3   Assessment and Plan: 1. Pneumonia due to COVID-19 virus resolving  2. Aortic thrombus (HCC) -on Eliquis - Ambulatory referral to Cardiothoracic Surgery  3. Hospital discharge follow-up Doing well  4. Language barrier pacific interpreters used and additional time performing visit was required.     Follow Up Instructions: 1 month assign PCP and labs   I discussed the assessment and treatment plan with the patient. The patient was provided an opportunity to ask questions and all were answered. The patient agreed with the plan and demonstrated an understanding of the instructions.   The patient was advised to call back or seek an in-person evaluation if the symptoms worsen or if  the condition fails to improve as anticipated.  I provided 14 minutes of non-face-to-face time during this encounter.   Freeman Caldron, PA-C

## 2018-10-30 ENCOUNTER — Ambulatory Visit: Payer: Medicare Other | Attending: Family Medicine | Admitting: Physician Assistant

## 2018-10-30 ENCOUNTER — Other Ambulatory Visit: Payer: Self-pay

## 2018-10-30 DIAGNOSIS — U071 COVID-19: Secondary | ICD-10-CM

## 2018-10-30 DIAGNOSIS — Z789 Other specified health status: Secondary | ICD-10-CM

## 2018-10-30 DIAGNOSIS — I7410 Embolism and thrombosis of unspecified parts of aorta: Secondary | ICD-10-CM | POA: Diagnosis not present

## 2018-10-30 DIAGNOSIS — J1289 Other viral pneumonia: Secondary | ICD-10-CM

## 2018-10-30 DIAGNOSIS — Z09 Encounter for follow-up examination after completed treatment for conditions other than malignant neoplasm: Secondary | ICD-10-CM | POA: Diagnosis not present

## 2018-10-30 DIAGNOSIS — J1282 Pneumonia due to coronavirus disease 2019: Secondary | ICD-10-CM

## 2018-10-30 NOTE — Progress Notes (Signed)
Patient verified DOB Patient has eaten today. Patient has taken medication Patient denies pain except for when coughing. Patient still has an intermittent dry cough. Patient denies any N/V diarrhea.

## 2018-12-09 ENCOUNTER — Encounter: Payer: Self-pay | Admitting: Family Medicine

## 2018-12-09 ENCOUNTER — Other Ambulatory Visit: Payer: Self-pay

## 2018-12-09 ENCOUNTER — Ambulatory Visit: Payer: Medicare Other | Attending: Family Medicine | Admitting: Family Medicine

## 2018-12-09 VITALS — BP 151/91 | HR 79 | Temp 98.2°F | Wt 169.2 lb

## 2018-12-09 DIAGNOSIS — I10 Essential (primary) hypertension: Secondary | ICD-10-CM

## 2018-12-09 DIAGNOSIS — Z7901 Long term (current) use of anticoagulants: Secondary | ICD-10-CM | POA: Diagnosis not present

## 2018-12-09 DIAGNOSIS — J45909 Unspecified asthma, uncomplicated: Secondary | ICD-10-CM | POA: Insufficient documentation

## 2018-12-09 DIAGNOSIS — I7410 Embolism and thrombosis of unspecified parts of aorta: Secondary | ICD-10-CM | POA: Diagnosis not present

## 2018-12-09 DIAGNOSIS — Z79899 Other long term (current) drug therapy: Secondary | ICD-10-CM | POA: Diagnosis not present

## 2018-12-09 MED ORDER — APIXABAN 5 MG PO TABS: 5.0000 mg | ORAL_TABLET | Freq: Two times a day (BID) | ORAL | 0 refills | Status: AC

## 2018-12-09 NOTE — Patient Instructions (Signed)
Hipertensin en los adultos Hypertension, Adult La presin arterial alta (hipertensin) se produce cuando la fuerza de la sangre bombea a travs de las arterias con mucha fuerza. Las arterias son los vasos sanguneos que transportan la sangre desde el corazn al resto del cuerpo. La hipertensin hace que el corazn haga ms esfuerzo para bombear sangre y puede provocar que las arterias se estrechen o endurezcan. La hipertensin no tratada o no controlada puede causar infarto de miocardio, insuficiencia cardaca, accidente cerebrovascular, enfermedad renal y otros problemas. Una lectura de la presin arterial consta de un nmero ms alto sobre un nmero ms bajo. En condiciones ideales, la presin arterial debe estar por debajo de 120/80. El primer nmero ("superior") es la presin sistlica. Es la medida de la presin de las arterias cuando el corazn late. El segundo nmero ("inferior") es la presin diastlica. Es la medida de la presin en las arterias cuando el corazn se relaja. Cules son las causas? Se desconoce la causa exacta de esta afeccin. Hay algunas afecciones que causan presin arterial alta o estn relacionadas con ella. Qu incrementa el riesgo? Algunos factores de riesgo de hipertensin estn bajo su control. Los siguientes factores pueden hacer que sea ms propenso a desarrollar esta afeccin:  Fumar.  Tener diabetes mellitus tipo 2, colesterol alto, o ambos.  No hacer la cantidad suficiente de actividad fsica o ejercicio.  Tener sobrepeso.  Consumir mucha grasa, azcar, caloras o sal (sodio) en su dieta.  Beber alcohol en exceso. Algunos factores de riesgo para la presin arterial alta pueden ser difciles o imposibles de cambiar. Algunos de estos factores son los siguientes:  Tener enfermedad renal crnica.  Tener antecedentes familiares de presin arterial alta.  Edad. Los riesgos aumentan con la edad.  Raza. El riesgo es mayor para las personas afroamericanas.   Sexo. Antes de los 45aos, los hombres corren ms riesgo que las mujeres. Despus de los 65aos, las mujeres corren ms riesgo que los hombres.  Tener apnea obstructiva del sueo.  Estrs. Cules son los signos o los sntomas? Es posible que la presin arterial alta puede no cause sntomas. La presin arterial muy alta (crisis hipertensiva) puede provocar:  Dolor de cabeza.  Ansiedad.  Falta de aire.  Hemorragia nasal.  Nuseas y vmitos.  Cambios en la visin.  Dolor de pecho intenso.  Convulsiones. Cmo se diagnostica? Esta afeccin se diagnostica al medir su presin arterial mientras se encuentra sentado, con el brazo apoyado sobre una superficie plana, las piernas sin cruzar y los pies bien apoyados en el piso. El brazalete del tensimetro debe colocarse directamente sobre la piel de la parte superior del brazo y al nivel de su corazn. Debe medirla al menos dos veces en el mismo brazo. Determinadas condiciones pueden causar una diferencia de presin arterial entre el brazo izquierdo y el derecho. Ciertos factores pueden provocar que las lecturas de la presin arterial sean inferiores o superiores a lo normal por un perodo corto de tiempo:  Si su presin arterial es ms alta cuando se encuentra en el consultorio del mdico que cuando la mide en su hogar, se denomina "hipertensin de bata blanca". La mayora de las personas que tienen esta afeccin no deben ser medicadas.  Si su presin arterial es ms alta en el hogar que cuando se encuentra en el consultorio del mdico, se denomina "hipertensin enmascarada". La mayora de las personas que tienen esta afeccin deben ser medicadas para controlar la presin arterial. Si tiene una lecturas de presin arterial alta durante   una visita o si tiene presin arterial normal con otros factores de riesgo, se le podr pedir que haga lo siguiente:  Que regrese otro da para volver a controlar su presin arterial nuevamente.  Que se  controle la presin arterial en su casa durante 1 semana o ms. Si se le diagnostica hipertensin, es posible que se le realicen otros anlisis de sangre o estudios de diagnstico por imgenes para ayudar a su mdico a comprender su riesgo general de tener otras afecciones. Cmo se trata? Esta afeccin se trata haciendo cambios saludables en el estilo de vida, tales como ingerir alimentos saludables, realizar ms ejercicio y reducir el consumo de alcohol. El mdico puede recetarle medicamentos si los cambios en el estilo de vida no son suficientes para lograr controlar la presin arterial y si:  Su presin arterial sistlica est por encima de 130.  Su presin arterial diastlica est por encima de 80. La presin arterial deseada puede variar en funcin de las enfermedades, la edad y otros factores personales. Siga estas instrucciones en su casa: Comida y bebida   Siga una dieta con alto contenido de fibras y potasio, y con bajo contenido de sodio, azcar agregada y grasas. Un ejemplo de plan alimenticio es la dieta DASH (Dietary Approaches to Stop Hypertension, Mtodos alimenticios para detener la hipertensin). Para alimentarse de esta manera: ? Coma mucha fruta y verdura fresca. Trate de que la mitad del plato de cada comida sea de frutas y verduras. ? Coma cereales integrales, como pasta integral, arroz integral o pan integral. Llene aproximadamente un cuarto del plato con cereales integrales. ? Coma y beba productos lcteos con bajo contenido de grasa, como leche descremada o yogur bajo en grasas. ? Evite la ingesta de cortes de carne grasa, carne procesada o curada, y carne de ave con piel. Llene aproximadamente un cuarto del plato con protenas magras, como pescado, pollo sin piel, frijoles, huevos o tofu. ? Evite ingerir alimentos prehechos y procesados. En general, estos tienen mayor cantidad de sodio, azcar agregada y grasa.  Reduzca su ingesta diaria de sodio. La mayora de las  personas que tienen hipertensin deben comer menos de 1500 mg de sodio por da.  No beba alcohol si: ? Su mdico le indica no hacerlo. ? Est embarazada, puede estar embarazada o est tratando de quedar embarazada.  Si bebe alcohol: ? Limite la cantidad que bebe a lo siguiente:  De 0 a 1 medida por da para las mujeres.  De 0 a 2 medidas por da para los hombres. ? Est atento a la cantidad de alcohol que hay en las bebidas que toma. En los [place], una medida equivale a una botella de cerveza de 12oz (355ml), un vaso de vino de 5oz (148ml) o un vaso de una bebida alcohlica de alta graduacin de 1oz (44ml). Estilo de vida   Trabaje con su mdico para mantener un peso saludable o perder peso. Pregntele cul es el peso recomendado para usted.  Haga al menos 30minutos de ejercicio la mayora de los das de la semana. Estas actividades pueden incluir caminar, nadar o andar en bicicleta.  Incluya ejercicios para fortalecer sus msculos (ejercicios de resistencia), como Pilates o levantamiento de pesas, como parte de su rutina semanal de ejercicios. Intente realizar 30minutos de este tipo de ejercicios al menos tres das a la semana.  No consuma ningn producto que contenga nicotina o tabaco, como cigarrillos, cigarrillos electrnicos y tabaco de mascar. Si necesita ayuda para dejar de fumar, consulte al   mdico.  Contrlese la presin arterial en su casa segn las indicaciones del mdico.  Concurra a todas las visitas de seguimiento como se lo haya indicado el mdico. Esto es importante. Medicamentos  Tome los medicamentos de venta libre y los recetados solamente como se lo haya indicado el mdico. Siga cuidadosamente las indicaciones. Los medicamentos para la presin arterial deben tomarse segn las indicaciones.  No omita las dosis de medicamentos para la presin arterial. Si lo hace, estar en riesgo de tener problemas y puede hacer que los medicamentos sean menos  eficaces.  Pregntele a su mdico a qu efectos secundarios o reacciones a los medicamentos debe prestar atencin. Comunquese con un mdico si:  Piensa que tiene una reaccin a un medicamento que est tomando.  Tiene dolores de cabeza frecuentes (recurrentes).  Se siente mareado.  Tiene hinchazn en los tobillos.  Tiene problemas de visin. Solicite ayuda inmediatamente si:  Siente un dolor de cabeza intenso o confusin.  Siente debilidad inusual o adormecimiento.  Siente que va a desmayarse.  Siente un dolor intenso en el pecho o el abdomen.  Vomita repetidas veces.  Tiene dificultad para respirar. Resumen  La hipertensin se produce cuando la sangre bombea en las arterias con mucha fuerza. Si esta afeccin no se controla, podra correr riesgo de tener complicaciones graves.  La presin arterial deseada puede variar en funcin de las enfermedades, la edad y otros factores personales. Para la mayora de las personas, una presin arterial normal es menor que 120/80.  La hipertensin se trata con cambios en el estilo de vida, medicamentos o una combinacin de ambos. Los cambios en el estilo de vida incluyen prdida de peso, ingerir alimentos sanos, seguir una dieta baja en sodio, hacer ms ejercicio y limitar el consumo de alcohol. Esta informacin no tiene como fin reemplazar el consejo del mdico. Asegrese de hacerle al mdico cualquier pregunta que tenga. Document Released: 04/03/2005 Document Revised: 01/17/2018 Document Reviewed: 01/17/2018 Elsevier Patient Education  2020 [employer].  

## 2018-12-09 NOTE — Progress Notes (Signed)
Subjective:  Patient ID: Charles Arias, male    DOB: Nov 14, 1955  Age: 63 y.o. MRN: [id number]  CC: Establish Care   HPI Charles Arias is a 63 year old male with a history of hypertension, hospitalization for acute respiratory disease secondary to COVID-19 (09/29/2018-  10/05/2018), hospitalization for aortic thrombus and COVID-19 pneumonia (10/15/2018- 10/20/2018). He had a hospital follow-up via telehealth visit on 10/30/2018 and reports doing well since then. He is compliant with his Eliquis and denies bruising or bleeding.  At the time of discharge plan was to refer him to cardiothoracic surgery due to incidental finding of aortic thrombus on CT angiogram of the chest however he never made it there. Hypercoagulable panel during hospitalization revealed elevated lupus anticoagulant of 59.2 (normal 0-51.9).  His blood pressure slightly elevated today and he endorses compliance with his antihypertensives except for today as he is yet to take his medication due to fasting in anticipation of labs.  Denies chest pain, dyspnea, has a normal appetite. He is seen today with a Spanish interpreter and has no additional concerns.   Past Medical History:  Diagnosis Date  . Asthma     No past surgical history on file.  No family history on file.  No Known Allergies  Outpatient Medications Prior to Visit  Medication Sig Dispense Refill  . albuterol (VENTOLIN HFA) 108 (90 Base) MCG/ACT inhaler Inhale 2 puffs into the lungs every 6 (six) hours as needed for wheezing or shortness of breath.     . metoprolol tartrate (LOPRESSOR) 25 MG tablet Take 1 tablet (25 mg total) by mouth 2 (two) times daily. 60 tablet 3  . apixaban (ELIQUIS) 5 MG TABS tablet Take 1 tablet (5 mg total) by mouth 2 (two) times daily. 60 tablet 3  . acetaminophen (TYLENOL) 500 MG tablet Take 1,000 mg by mouth every 6 (six) hours as needed for fever or headache (pain).    . benzonatate (TESSALON) 100 MG capsule Take 200 mg by mouth 3  (three) times daily as needed for cough.      No facility-administered medications prior to visit.      ROS Review of Systems  Constitutional: Negative for activity change and appetite change.  HENT: Negative for sinus pressure and sore throat.   Eyes: Negative for visual disturbance.  Respiratory: Negative for cough, chest tightness and shortness of breath.   Cardiovascular: Negative for chest pain and leg swelling.  Gastrointestinal: Negative for abdominal distention, abdominal pain, constipation and diarrhea.  Endocrine: Negative.   Genitourinary: Negative for dysuria.  Musculoskeletal: Negative for joint swelling and myalgias.  Skin: Negative for rash.  Allergic/Immunologic: Negative.   Neurological: Negative for weakness, light-headedness and numbness.  Psychiatric/Behavioral: Negative for dysphoric mood and suicidal ideas.    Objective:  BP (!) 151/91   Pulse 79   Temp 98.2 F (36.8 C) (Oral)   Wt 169 lb 3.2 oz (76.7 kg)   SpO2 98%   BMI 29.97 kg/m   BP/Weight 12/09/2018 10/20/2018 9/48/5462  Systolic BP 703 500 -  Diastolic BP 91 76 -  Wt. (Lbs) 169.2 - 155  BMI 29.97 - 27.46      Physical Exam Constitutional:      Appearance: He is well-developed.  HENT:     Head: Normocephalic and atraumatic.     Right Ear: External ear normal.     Left Ear: External ear normal.  Eyes:     Conjunctiva/sclera: Conjunctivae normal.     Pupils: Pupils are equal, round, and reactive  to light.  Neck:     Musculoskeletal: Normal range of motion and neck supple.     Trachea: No tracheal deviation.  Cardiovascular:     Rate and Rhythm: Normal rate and regular rhythm.     Heart sounds: Normal heart sounds. No murmur.  Pulmonary:     Effort: Pulmonary effort is normal. No respiratory distress.     Breath sounds: Normal breath sounds. No wheezing.  Chest:     Chest wall: No tenderness.     Breasts:        Right: Normal. No mass or tenderness.        Left: Normal. No mass or  tenderness.  Abdominal:     General: Bowel sounds are normal.     Palpations: Abdomen is soft. There is no mass.     Tenderness: There is no abdominal tenderness.  Genitourinary:    Comments: Normal appearance of external genitalia, vagina and cervix. No cervical motion tenderness Musculoskeletal: Normal range of motion.        General: No tenderness.  Skin:    General: Skin is warm and dry.  Neurological:     Mental Status: He is alert and oriented to person, place, and time.     CMP Latest Ref Rng & Units 10/17/2018 10/15/2018 10/05/2018  Glucose 70 - 99 mg/dL 163(H) 107(H) 203(H)  BUN 8 - 23 mg/dL 14 10 35(H)  Creatinine 0.61 - 1.24 mg/dL 0.86 0.90 0.76  Sodium 135 - 145 mmol/L 134(L) 135 137  Potassium 3.5 - 5.1 mmol/L 4.3 4.6 4.9  Chloride 98 - 111 mmol/L 105 105 99  CO2 22 - 32 mmol/L '22 23 26  '$ Calcium 8.9 - 10.3 mg/dL 8.7(L) 8.4(L) 8.7(L)  Total Protein 6.5 - 8.1 g/dL - 5.4(L) 5.8(L)  Total Bilirubin 0.3 - 1.2 mg/dL - 1.0 0.6  Alkaline Phos 38 - 126 U/L - 57 112  AST 15 - 41 U/L - 27 25  ALT 0 - 44 U/L - 35 102(H)    Lipid Panel  No results found for: CHOL, TRIG, HDL, CHOLHDL, VLDL, LDLCALC, LDLDIRECT  CBC    Component Value Date/Time   WBC 7.6 10/20/2018 0427   RBC 4.61 10/20/2018 0427   HGB 14.1 10/20/2018 0427   HCT 43.2 10/20/2018 0427   PLT 198 10/20/2018 0427   MCV 93.7 10/20/2018 0427   MCH 30.6 10/20/2018 0427   MCHC 32.6 10/20/2018 0427   RDW 13.2 10/20/2018 0427   LYMPHSABS 1.6 10/15/2018 2030   MONOABS 1.1 (H) 10/15/2018 2030   EOSABS 0.1 10/15/2018 2030   BASOSABS 0.1 10/15/2018 2030    Lab Results  Component Value Date   HGBA1C 6.5 (H) 10/04/2018    Assessment & Plan:   1. Aortic thrombus (HCC) Aortic thrombus noticed around the time of diagnosis with COVID-19 Hypercoagulable panel in 09/2018 was negative We will repeat in 3 months once he is done with his anticoagulation on 01/05/2019 Advised he would need to be 2 weeks out of cessation  of anticoagulation prior to labs Plan at discharge was for him to see cardiothoracic surgeon which he is yet to do; I will refer him - Factor V Leiden; Future - Homocysteine; Future - Lupus anticoagulant panel; Future - Protein S, total and free; Future - Protein C, total; Future - apixaban (ELIQUIS) 5 MG TABS tablet; Take 1 tablet (5 mg total) by mouth 2 (two) times daily.  Dispense: 60 tablet; Refill: 0 - Ambulatory referral to Cardiothoracic Surgery  2. Essential hypertension Uncontrolled due to not taking antihypertensive today as he is fasting in anticipation of labs Counseled on blood pressure goal of less than 130/80, low-sodium, DASH diet, medication compliance, 150 minutes of moderate intensity exercise per week. Discussed medication compliance, adverse effects. - CMP14+EGFR - Lipid panel   Meds ordered this encounter  Medications  . apixaban (ELIQUIS) 5 MG TABS tablet    Sig: Take 1 tablet (5 mg total) by mouth 2 (two) times daily.    Dispense:  60 tablet    Refill:  0    Follow-up: Return in about 3 months (around 03/11/2019) for medical conditions.       Charlott Rakes, MD, FAAFP. [facility] [place], [place]   12/09/2018, 10:07 AM

## 2018-12-09 NOTE — Progress Notes (Signed)
Patient is fasting.  No meds this morning.

## 2018-12-10 ENCOUNTER — Other Ambulatory Visit: Payer: Self-pay | Admitting: Family Medicine

## 2018-12-10 DIAGNOSIS — Z1159 Encounter for screening for other viral diseases: Secondary | ICD-10-CM

## 2018-12-10 LAB — CMP14+EGFR
ALT: 22 IU/L (ref 0–44)
AST: 17 IU/L (ref 0–40)
Albumin/Globulin Ratio: 1.5 (ref 1.2–2.2)
Albumin: 4.1 g/dL (ref 3.8–4.8)
Alkaline Phosphatase: 88 IU/L (ref 39–117)
BUN/Creatinine Ratio: 14 (ref 10–24)
BUN: 10 mg/dL (ref 8–27)
Bilirubin Total: 0.3 mg/dL (ref 0.0–1.2)
CO2: 23 mmol/L (ref 20–29)
Calcium: 9.1 mg/dL (ref 8.6–10.2)
Chloride: 104 mmol/L (ref 96–106)
Creatinine, Ser: 0.73 mg/dL — ABNORMAL LOW (ref 0.76–1.27)
GFR calc Af Amer: 113 mL/min/{1.73_m2} (ref >59)
GFR calc non Af Amer: 97 mL/min/{1.73_m2} (ref >59)
Globulin, Total: 2.8 g/dL (ref 1.5–4.5)
Glucose: 93 mg/dL (ref 65–99)
Potassium: 4.4 mmol/L (ref 3.5–5.2)
Sodium: 140 mmol/L (ref 134–144)
Total Protein: 6.9 g/dL (ref 6.0–8.5)

## 2018-12-10 LAB — LIPID PANEL
Chol/HDL Ratio: 5.7 ratio — ABNORMAL HIGH (ref 0.0–5.0)
Cholesterol, Total: 216 mg/dL — ABNORMAL HIGH (ref 100–199)
HDL: 38 mg/dL — ABNORMAL LOW (ref >39)
LDL Calculated: 140 mg/dL — ABNORMAL HIGH (ref 0–99)
Triglycerides: 189 mg/dL — ABNORMAL HIGH (ref 0–149)
VLDL Cholesterol Cal: 38 mg/dL (ref 5–40)

## 2018-12-10 MED ORDER — ATORVASTATIN CALCIUM 20 MG PO TABS: 20.0000 mg | ORAL_TABLET | Freq: Every day | ORAL | 3 refills | Status: DC

## 2018-12-12 ENCOUNTER — Other Ambulatory Visit: Payer: Self-pay | Admitting: *Deleted

## 2018-12-12 DIAGNOSIS — I7410 Embolism and thrombosis of unspecified parts of aorta: Secondary | ICD-10-CM

## 2018-12-12 NOTE — Progress Notes (Unsigned)
Ct a 

## 2018-12-18 ENCOUNTER — Telehealth: Payer: Self-pay

## 2018-12-18 NOTE — Telephone Encounter (Signed)
[employer] Romero Liner  Id# [id number]  contacted pt to go over lab results pt didn't answer left a detailed vm informing pt of results and if he has any questions or concerns to give me a call

## 2018-12-30 ENCOUNTER — Inpatient Hospital Stay (HOSPITAL_COMMUNITY): Admission: RE | Admit: 2018-12-30 | Payer: Medicare Other | Source: Ambulatory Visit

## 2018-12-30 ENCOUNTER — Other Ambulatory Visit: Payer: Self-pay

## 2018-12-30 DIAGNOSIS — Z20822 Contact with and (suspected) exposure to covid-19: Secondary | ICD-10-CM

## 2018-12-31 LAB — NOVEL CORONAVIRUS, NAA: SARS-CoV-2, NAA: NOT DETECTED

## 2019-01-02 ENCOUNTER — Ambulatory Visit
Admission: RE | Admit: 2019-01-02 | Discharge: 2019-01-02 | Disposition: A | Discharge status: Home / Self Care | Payer: Medicare Other | Source: Ambulatory Visit | Attending: Cardiothoracic Surgery | Admitting: Cardiothoracic Surgery

## 2019-01-02 DIAGNOSIS — I7410 Embolism and thrombosis of unspecified parts of aorta: Secondary | ICD-10-CM

## 2019-01-02 MED ORDER — IOPAMIDOL (ISOVUE-370) INJECTION 76%
75.0000 mL | Freq: Once | INTRAVENOUS | Status: AC | PRN
  Administered 2019-01-02: 75 mL via INTRAVENOUS

## 2019-01-03 ENCOUNTER — Encounter: Payer: Medicare Other | Admitting: Cardiothoracic Surgery

## 2019-03-11 ENCOUNTER — Ambulatory Visit: Payer: Medicare Other | Attending: Family Medicine | Admitting: Family Medicine

## 2019-03-11 ENCOUNTER — Other Ambulatory Visit: Payer: Self-pay

## 2019-03-11 DIAGNOSIS — I7410 Embolism and thrombosis of unspecified parts of aorta: Secondary | ICD-10-CM

## 2019-03-11 DIAGNOSIS — I10 Essential (primary) hypertension: Secondary | ICD-10-CM | POA: Diagnosis not present

## 2019-03-11 DIAGNOSIS — E785 Hyperlipidemia, unspecified: Secondary | ICD-10-CM

## 2019-03-11 MED ORDER — CETIRIZINE HCL 10 MG PO TABS: 10.0000 mg | ORAL_TABLET | Freq: Every day | ORAL | 1 refills | Status: AC

## 2019-03-11 MED ORDER — METOPROLOL TARTRATE 25 MG PO TABS: 25.0000 mg | ORAL_TABLET | Freq: Two times a day (BID) | ORAL | 6 refills | Status: AC

## 2019-03-11 MED ORDER — ATORVASTATIN CALCIUM 20 MG PO TABS: 20.0000 mg | ORAL_TABLET | Freq: Every day | ORAL | 6 refills | Status: AC

## 2019-03-11 NOTE — Progress Notes (Signed)
Virtual Visit via Telephone Note  I connected with [name], on 03/11/2019 at 10:12 AM by telephone due to the COVID-19 pandemic and verified that I am speaking with the correct person using two identifiers.   Consent: I discussed the limitations, risks, security and privacy concerns of performing an evaluation and management service by telephone and the availability of in person appointments. I also discussed with the patient that there may be a patient responsible charge related to this service. The patient expressed understanding and agreed to proceed.   Location of Patient: Environmental education officer of Provider: Clinic   Persons participating in Telemedicine visit: [place], [place] son [place] - interpreter Dr. Margarita Rana     History of Present Illness: Charles Arias is a 63 year old male with a history of hypertension, hospitalization for acute respiratory disease secondary to COVID-19 (09/29/2018-  10/05/2018), hospitalization for aortic thrombus and COVID-19 pneumonia (10/15/2018- 10/20/2018). His visit was converted to Telehealth visit due to the presence of upper respiratory symptoms. He was on anticoagulation with Eliquis for 3 months and referred to Cardiothoracic surgery due to aortic thrombus but appointment was cancelled after repeat CT chest was negative for PE or residual thrombus.   He is no longer on anticoagulation. Today he complains of a sore throat and feels like 'something' is in his throat which has been present for 'a while' and he feels like he has to clear his throat a lot. Denies presence of facial pressure. He has no fever. He has a little cough which is sometimes productive of whitish sputum  He has not been on his antihypertensive or statin and his son states he was unaware he needed to remain on them. He was also to have hypercoagulable labs and is wondering if they can be done in [place] since he lives there. Past Medical  History:  Diagnosis Date  . Asthma    No Known Allergies  Current Outpatient Medications on File Prior to Visit  Medication Sig Dispense Refill  . acetaminophen (TYLENOL) 500 MG tablet Take 1,000 mg by mouth every 6 (six) hours as needed for fever or headache (pain).    Marland Kitchen albuterol (VENTOLIN HFA) 108 (90 Base) MCG/ACT inhaler Inhale 2 puffs into the lungs every 6 (six) hours as needed for wheezing or shortness of breath.     Marland Kitchen apixaban (ELIQUIS) 5 MG TABS tablet Take 1 tablet (5 mg total) by mouth 2 (two) times daily. 60 tablet 0  . atorvastatin (LIPITOR) 20 MG tablet Take 1 tablet (20 mg total) by mouth daily. 30 tablet 3  . benzonatate (TESSALON) 100 MG capsule Take 200 mg by mouth 3 (three) times daily as needed for cough.     . metoprolol tartrate (LOPRESSOR) 25 MG tablet Take 1 tablet (25 mg total) by mouth 2 (two) times daily. 60 tablet 3   No current facility-administered medications on file prior to visit.     Observations/Objective: Awake, alert, oriented x3 Not in acute distress  CMP Latest Ref Rng & Units 12/09/2018 10/17/2018 10/15/2018  Glucose 65 - 99 mg/dL 93 163(H) 107(H)  BUN 8 - 27 mg/dL 10 14 10   Creatinine 0.76 - 1.27 mg/dL 0.73(L) 0.86 0.90  Sodium 134 - 144 mmol/L 140 134(L) 135  Potassium 3.5 - 5.2 mmol/L 4.4 4.3 4.6  Chloride 96 - 106 mmol/L 104 105 105  CO2 20 - 29 mmol/L 23 22 23   Calcium 8.6 - 10.2 mg/dL 9.1 8.7(L) 8.4(L)  Total Protein 6.0 -  8.5 g/dL 6.9 - 5.4(L)  Total Bilirubin 0.0 - 1.2 mg/dL 0.3 - 1.0  Alkaline Phos 39 - 117 IU/L 88 - 57  AST 0 - 40 IU/L 17 - 27  ALT 0 - 44 IU/L 22 - 35    Lipid Panel     Component Value Date/Time   CHOL 216 (H) 12/09/2018 0951   TRIG 189 (H) 12/09/2018 0951   HDL 38 (L) 12/09/2018 0951   CHOLHDL 5.7 (H) 12/09/2018 0951   LDLCALC 140 (H) 12/09/2018 0951   LABVLDL 38 12/09/2018 0951    Assessment and Plan: 1. Essential hypertension Likely to be uncontrolled as he has not ben taking Metoprolol which I have  advised him to resume Counseled on blood pressure goal of less than 130/80, low-sodium, DASH diet, medication compliance, 150 minutes of moderate intensity exercise per week. Discussed medication compliance, adverse effects. - metoprolol tartrate (LOPRESSOR) 25 MG tablet; Take 1 tablet (25 mg total) by mouth 2 (two) times daily.  Dispense: 60 tablet; Refill: 6  2. Aortic thrombus (HCC) Completed course of anticoagulation with Eliquis Thrombus has resolved Advised hypercoagulable lab can be done in [facility] in [place]  3. Dyslipidemia - atorvastatin (LIPITOR) 20 MG tablet; Take 1 tablet (20 mg total) by mouth daily.  Dispense: 30 tablet; Refill: 6   Follow Up Instructions: 3 months   I discussed the assessment and treatment plan with the patient. The patient was provided an opportunity to ask questions and all were answered. The patient agreed with the plan and demonstrated an understanding of the instructions.   The patient was advised to call back or seek an in-person evaluation if the symptoms worsen or if the condition fails to improve as anticipated.     I provided 15 minutes total of non-face-to-face time during this encounter including median intraservice time, reviewing previous notes, labs, imaging, medications, management and patient verbalized understanding.     Hoy Register, MD, FAAFP. [facility] [place], [place] [phone]   03/11/2019, 10:12 AM

## 2019-03-11 NOTE — Progress Notes (Signed)
Patient verified DOB Patient has not eaten Patient has not taken medication Patient complains of a sore throat and having COVID in June. Patient complains of constant dryness in the throat.

## 2020-05-05 DIAGNOSIS — Z125 Encounter for screening for malignant neoplasm of prostate: Secondary | ICD-10-CM | POA: Diagnosis not present

## 2020-05-05 DIAGNOSIS — Z13220 Encounter for screening for lipoid disorders: Secondary | ICD-10-CM | POA: Diagnosis not present

## 2020-05-05 DIAGNOSIS — Z23 Encounter for immunization: Secondary | ICD-10-CM | POA: Diagnosis not present

## 2020-05-05 DIAGNOSIS — R7303 Prediabetes: Secondary | ICD-10-CM | POA: Diagnosis not present

## 2020-05-05 DIAGNOSIS — G479 Sleep disorder, unspecified: Secondary | ICD-10-CM | POA: Diagnosis not present

## 2020-05-05 DIAGNOSIS — Z7689 Persons encountering health services in other specified circumstances: Secondary | ICD-10-CM | POA: Diagnosis not present

## 2020-05-05 DIAGNOSIS — J45909 Unspecified asthma, uncomplicated: Secondary | ICD-10-CM | POA: Diagnosis not present

## 2020-05-05 DIAGNOSIS — E785 Hyperlipidemia, unspecified: Secondary | ICD-10-CM | POA: Diagnosis not present

## 2020-05-26 DIAGNOSIS — Z139 Encounter for screening, unspecified: Secondary | ICD-10-CM | POA: Diagnosis not present

## 2020-05-26 DIAGNOSIS — Z1331 Encounter for screening for depression: Secondary | ICD-10-CM | POA: Diagnosis not present

## 2020-05-26 DIAGNOSIS — R7303 Prediabetes: Secondary | ICD-10-CM | POA: Diagnosis not present

## 2020-05-26 DIAGNOSIS — Z1339 Encounter for screening examination for other mental health and behavioral disorders: Secondary | ICD-10-CM | POA: Diagnosis not present

## 2020-05-26 DIAGNOSIS — E785 Hyperlipidemia, unspecified: Secondary | ICD-10-CM | POA: Diagnosis not present

## 2020-05-26 DIAGNOSIS — Z0000 Encounter for general adult medical examination without abnormal findings: Secondary | ICD-10-CM | POA: Diagnosis not present

## 2020-05-26 DIAGNOSIS — Z6826 Body mass index (BMI) 26.0-26.9, adult: Secondary | ICD-10-CM | POA: Diagnosis not present

## 2020-05-26 DIAGNOSIS — Z23 Encounter for immunization: Secondary | ICD-10-CM | POA: Diagnosis not present

## 2020-10-22 DIAGNOSIS — E785 Hyperlipidemia, unspecified: Secondary | ICD-10-CM | POA: Diagnosis not present

## 2020-10-22 DIAGNOSIS — R7303 Prediabetes: Secondary | ICD-10-CM | POA: Diagnosis not present

## 2020-10-29 DIAGNOSIS — J45909 Unspecified asthma, uncomplicated: Secondary | ICD-10-CM | POA: Diagnosis not present

## 2020-10-29 DIAGNOSIS — R7303 Prediabetes: Secondary | ICD-10-CM | POA: Diagnosis not present

## 2020-10-29 DIAGNOSIS — Z1211 Encounter for screening for malignant neoplasm of colon: Secondary | ICD-10-CM | POA: Diagnosis not present

## 2020-10-29 DIAGNOSIS — E785 Hyperlipidemia, unspecified: Secondary | ICD-10-CM | POA: Diagnosis not present

## 2020-11-03 ENCOUNTER — Other Ambulatory Visit: Payer: Self-pay | Admitting: Family Medicine

## 2020-11-03 DIAGNOSIS — E785 Hyperlipidemia, unspecified: Secondary | ICD-10-CM

## 2020-11-04 NOTE — Telephone Encounter (Signed)
   Notes to clinic:  script request is expired  Patient hasn't been seen in over a year    Requested Prescriptions  Pending Prescriptions Disp Refills   atorvastatin (LIPITOR) 20 MG tablet [Pharmacy Med Name: Atorvastatin Calcium 20 MG Oral Tablet] 30 tablet 0    Sig: Take 1 tablet by mouth once daily      Cardiovascular:  Antilipid - Statins Failed - 11/03/2020  6:23 PM      Failed - Total Cholesterol in normal range and within 360 days    Cholesterol, Total  Date Value Ref Range Status  12/09/2018 216 (H) 100 - 199 mg/dL Final          Failed - LDL in normal range and within 360 days    LDL Calculated  Date Value Ref Range Status  12/09/2018 140 (H) 0 - 99 mg/dL Final    Comment:    **Effective December 16, 2018, [facility] is implementing an improved** equation to calculate Low Density Lipoprotein Cholesterol (LDL-C) concentrations, to be used in all lipid panels that report calculated LDL-C. This equation was developed through a collaboration with the [employer], [employer] and [employer] (NIH).[1] The NIH calculation overcomes the limitations of the existing Friedewald LDL-C equation and performs equally well in both fasting and non-fasting individuals. 1. Rebbeca Paul Q, et al. A new equation for calculation of low-density lipoprotein cholesterol in patients with normolipidemia and/or hypertriglyceridemia. JAMA Cardiol. 2020 Feb 26. doi:10.1001/jamacardio.2020.0013           Failed - HDL in normal range and within 360 days    HDL  Date Value Ref Range Status  12/09/2018 38 (L) >39 mg/dL Final          Failed - Triglycerides in normal range and within 360 days    Triglycerides  Date Value Ref Range Status  12/09/2018 189 (H) 0 - 149 mg/dL Final          Failed - Valid encounter within last 12 months    Recent Outpatient Visits           1 year ago Essential hypertension   [facility] Hoy Register, MD    1 year ago Aortic thrombus [facility])   [facility] Hoy Register, [place]   2 years ago Pneumonia due to COVID-19 virus   [facility] [place], [place], [place]                Passed - Patient is not pregnant

## 2020-11-23 DIAGNOSIS — I509 Heart failure, unspecified: Secondary | ICD-10-CM | POA: Diagnosis not present

## 2020-11-23 DIAGNOSIS — Z0001 Encounter for general adult medical examination with abnormal findings: Secondary | ICD-10-CM | POA: Diagnosis not present

## 2020-11-23 DIAGNOSIS — E785 Hyperlipidemia, unspecified: Secondary | ICD-10-CM | POA: Diagnosis not present

## 2020-11-23 DIAGNOSIS — Z6826 Body mass index (BMI) 26.0-26.9, adult: Secondary | ICD-10-CM | POA: Diagnosis not present

## 2020-11-23 DIAGNOSIS — R7303 Prediabetes: Secondary | ICD-10-CM | POA: Diagnosis not present

## 2020-11-23 DIAGNOSIS — J45909 Unspecified asthma, uncomplicated: Secondary | ICD-10-CM | POA: Diagnosis not present

## 2020-11-24 IMAGING — CR PORTABLE CHEST - 1 VIEW
1 series · 1 of 1 positions shown · non-contrast
Comparison: None.

CLINICAL DATA: Shortness of breath, chest pain

EXAM:
PORTABLE CHEST 1 VIEW

[AP]
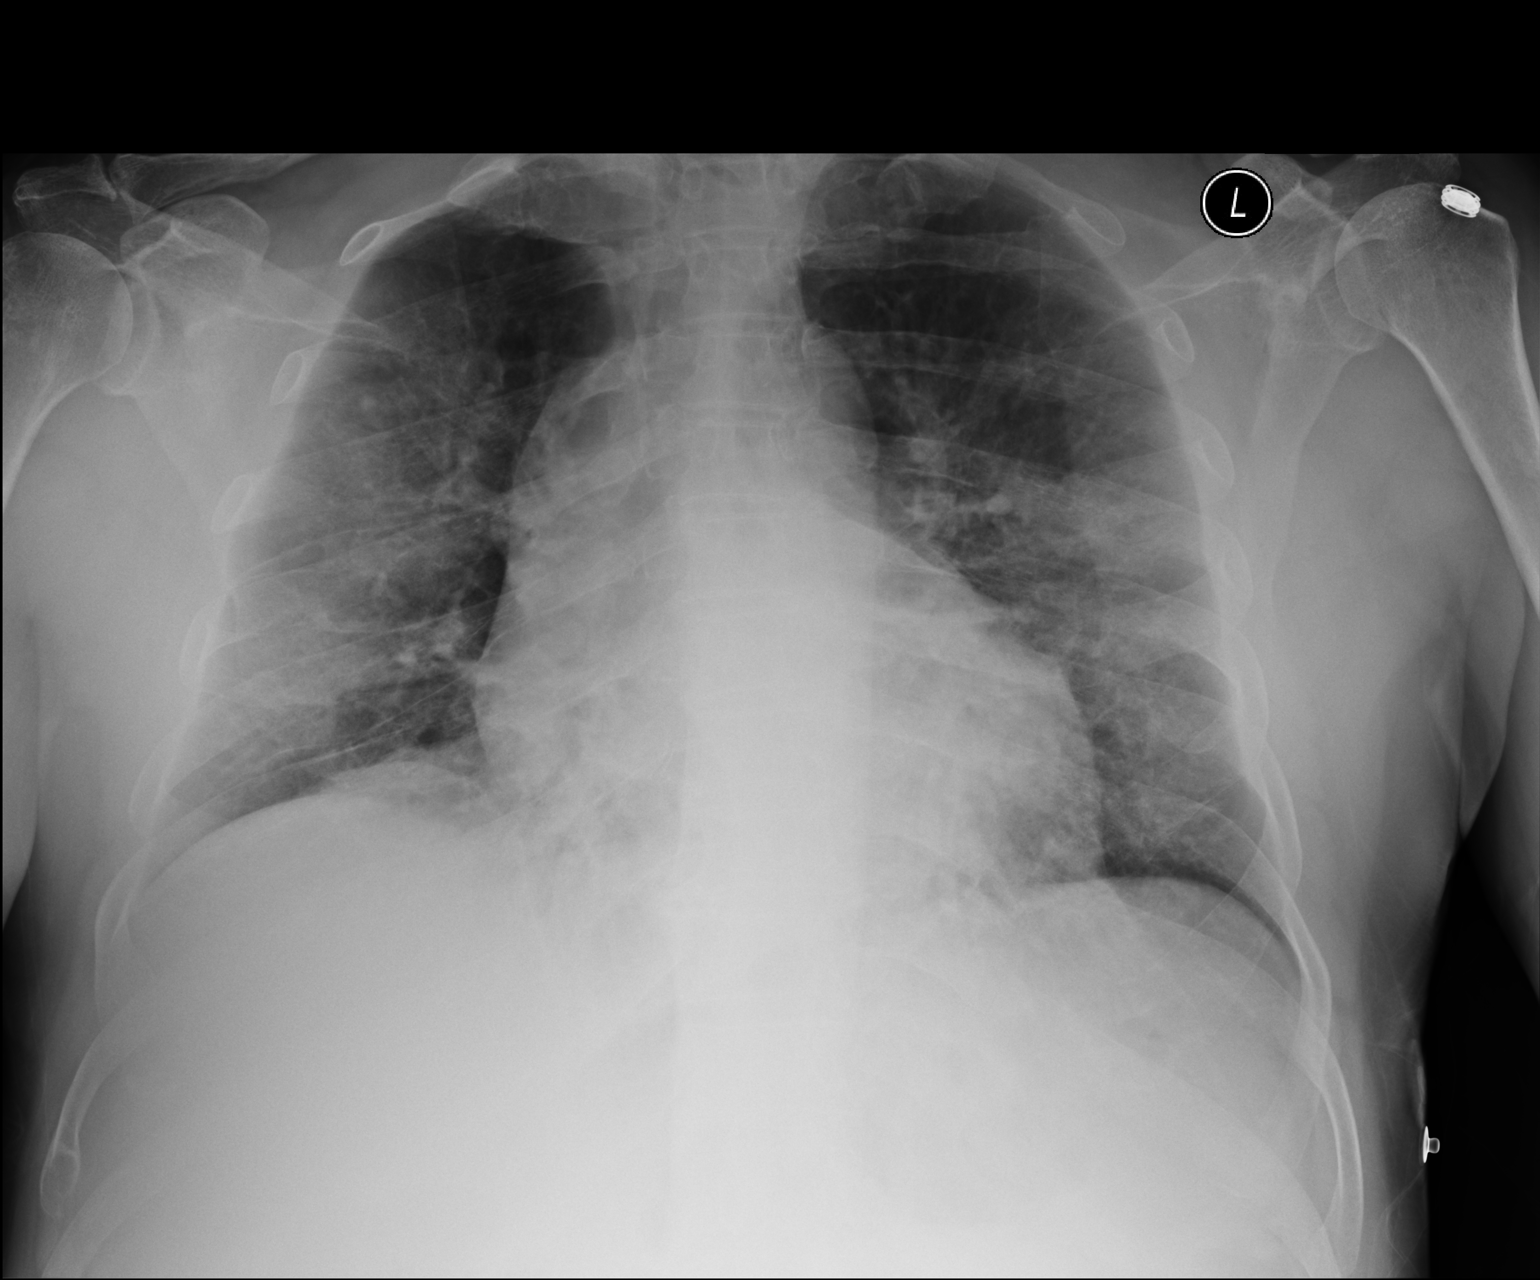

[1 of 1 positions shown; findings below may reference images not displayed]

FINDINGS: Heart is upper limits normal in size. Suspect patchy bilateral
airspace opacities. No effusions. No acute bony abnormality.
IMPRESSION: Patchy bilateral airspace opacities concerning for pneumonia.

## 2021-05-02 DIAGNOSIS — R7303 Prediabetes: Secondary | ICD-10-CM | POA: Diagnosis not present

## 2021-05-02 DIAGNOSIS — E785 Hyperlipidemia, unspecified: Secondary | ICD-10-CM | POA: Diagnosis not present

## 2021-05-02 DIAGNOSIS — Z125 Encounter for screening for malignant neoplasm of prostate: Secondary | ICD-10-CM | POA: Diagnosis not present

## 2021-05-09 DIAGNOSIS — I509 Heart failure, unspecified: Secondary | ICD-10-CM | POA: Diagnosis not present

## 2021-05-09 DIAGNOSIS — R7303 Prediabetes: Secondary | ICD-10-CM | POA: Diagnosis not present

## 2021-05-09 DIAGNOSIS — E785 Hyperlipidemia, unspecified: Secondary | ICD-10-CM | POA: Diagnosis not present

## 2021-05-09 DIAGNOSIS — Z6826 Body mass index (BMI) 26.0-26.9, adult: Secondary | ICD-10-CM | POA: Diagnosis not present

## 2021-05-09 DIAGNOSIS — J45909 Unspecified asthma, uncomplicated: Secondary | ICD-10-CM | POA: Diagnosis not present

## 2021-12-23 DIAGNOSIS — R7303 Prediabetes: Secondary | ICD-10-CM | POA: Diagnosis not present

## 2021-12-23 DIAGNOSIS — Z6826 Body mass index (BMI) 26.0-26.9, adult: Secondary | ICD-10-CM | POA: Diagnosis not present

## 2021-12-23 DIAGNOSIS — J45909 Unspecified asthma, uncomplicated: Secondary | ICD-10-CM | POA: Diagnosis not present

## 2021-12-23 DIAGNOSIS — E785 Hyperlipidemia, unspecified: Secondary | ICD-10-CM | POA: Diagnosis not present

## 2022-01-05 DIAGNOSIS — Z1339 Encounter for screening examination for other mental health and behavioral disorders: Secondary | ICD-10-CM | POA: Diagnosis not present

## 2022-01-05 DIAGNOSIS — Z1331 Encounter for screening for depression: Secondary | ICD-10-CM | POA: Diagnosis not present

## 2022-01-05 DIAGNOSIS — Z139 Encounter for screening, unspecified: Secondary | ICD-10-CM | POA: Diagnosis not present

## 2022-01-05 DIAGNOSIS — Z0000 Encounter for general adult medical examination without abnormal findings: Secondary | ICD-10-CM | POA: Diagnosis not present

## 2022-01-05 DIAGNOSIS — Z136 Encounter for screening for cardiovascular disorders: Secondary | ICD-10-CM | POA: Diagnosis not present

## 2022-02-13 DIAGNOSIS — Z23 Encounter for immunization: Secondary | ICD-10-CM | POA: Diagnosis not present

## 2022-02-13 DIAGNOSIS — R413 Other amnesia: Secondary | ICD-10-CM | POA: Diagnosis not present

## 2022-02-13 DIAGNOSIS — Z6826 Body mass index (BMI) 26.0-26.9, adult: Secondary | ICD-10-CM | POA: Diagnosis not present

## 2022-06-27 DIAGNOSIS — R7303 Prediabetes: Secondary | ICD-10-CM | POA: Diagnosis not present

## 2022-06-27 DIAGNOSIS — E785 Hyperlipidemia, unspecified: Secondary | ICD-10-CM | POA: Diagnosis not present

## 2022-06-27 DIAGNOSIS — Z125 Encounter for screening for malignant neoplasm of prostate: Secondary | ICD-10-CM | POA: Diagnosis not present

## 2022-07-03 DIAGNOSIS — R7303 Prediabetes: Secondary | ICD-10-CM | POA: Diagnosis not present

## 2022-07-03 DIAGNOSIS — J45909 Unspecified asthma, uncomplicated: Secondary | ICD-10-CM | POA: Diagnosis not present

## 2022-07-03 DIAGNOSIS — Z6826 Body mass index (BMI) 26.0-26.9, adult: Secondary | ICD-10-CM | POA: Diagnosis not present

## 2022-07-03 DIAGNOSIS — E785 Hyperlipidemia, unspecified: Secondary | ICD-10-CM | POA: Diagnosis not present

## 2022-10-31 DIAGNOSIS — R7303 Prediabetes: Secondary | ICD-10-CM | POA: Diagnosis not present

## 2022-10-31 DIAGNOSIS — E785 Hyperlipidemia, unspecified: Secondary | ICD-10-CM | POA: Diagnosis not present

## 2022-11-20 DIAGNOSIS — I509 Heart failure, unspecified: Secondary | ICD-10-CM | POA: Diagnosis not present

## 2022-11-20 DIAGNOSIS — J45909 Unspecified asthma, uncomplicated: Secondary | ICD-10-CM | POA: Diagnosis not present

## 2022-11-20 DIAGNOSIS — Z6825 Body mass index (BMI) 25.0-25.9, adult: Secondary | ICD-10-CM | POA: Diagnosis not present

## 2022-11-20 DIAGNOSIS — R7303 Prediabetes: Secondary | ICD-10-CM | POA: Diagnosis not present

## 2022-11-20 DIAGNOSIS — E785 Hyperlipidemia, unspecified: Secondary | ICD-10-CM | POA: Diagnosis not present

## 2023-05-29 DIAGNOSIS — R7303 Prediabetes: Secondary | ICD-10-CM | POA: Diagnosis not present

## 2023-05-29 DIAGNOSIS — E785 Hyperlipidemia, unspecified: Secondary | ICD-10-CM | POA: Diagnosis not present

## 2023-06-04 DIAGNOSIS — Z1339 Encounter for screening examination for other mental health and behavioral disorders: Secondary | ICD-10-CM | POA: Diagnosis not present

## 2023-06-04 DIAGNOSIS — E785 Hyperlipidemia, unspecified: Secondary | ICD-10-CM | POA: Diagnosis not present

## 2023-06-04 DIAGNOSIS — Z0000 Encounter for general adult medical examination without abnormal findings: Secondary | ICD-10-CM | POA: Diagnosis not present

## 2023-06-04 DIAGNOSIS — Z139 Encounter for screening, unspecified: Secondary | ICD-10-CM | POA: Diagnosis not present

## 2023-06-04 DIAGNOSIS — Z23 Encounter for immunization: Secondary | ICD-10-CM | POA: Diagnosis not present

## 2023-06-04 DIAGNOSIS — R7303 Prediabetes: Secondary | ICD-10-CM | POA: Diagnosis not present

## 2023-06-04 DIAGNOSIS — Z1331 Encounter for screening for depression: Secondary | ICD-10-CM | POA: Diagnosis not present

## 2023-06-04 DIAGNOSIS — Z136 Encounter for screening for cardiovascular disorders: Secondary | ICD-10-CM | POA: Diagnosis not present

## 2023-06-04 DIAGNOSIS — Z6825 Body mass index (BMI) 25.0-25.9, adult: Secondary | ICD-10-CM | POA: Diagnosis not present

## 2023-06-04 DIAGNOSIS — J45909 Unspecified asthma, uncomplicated: Secondary | ICD-10-CM | POA: Diagnosis not present

## 2023-07-20 DIAGNOSIS — Z0100 Encounter for examination of eyes and vision without abnormal findings: Secondary | ICD-10-CM | POA: Diagnosis not present

## 2023-07-20 DIAGNOSIS — H25813 Combined forms of age-related cataract, bilateral: Secondary | ICD-10-CM | POA: Diagnosis not present

## 2023-11-20 ENCOUNTER — Encounter: Payer: Self-pay | Admitting: Pharmacist

## 2023-11-20 NOTE — Progress Notes (Signed)
 Pharmacy Quality Measure Review  This patient is appearing on a report for being at risk of failing the adherence measure for cholesterol (statin) medications this calendar year.   Medication: atorvastatin  10 mg  Last fill date: 10/07/2023 for 90 day supply  Insurance report was not up to date. No action needed at this time.   Annabella Galla, PharmD Clinical Pharmacist [facility] Dial: [phone]

## 2023-11-26 DIAGNOSIS — J45909 Unspecified asthma, uncomplicated: Secondary | ICD-10-CM | POA: Diagnosis not present

## 2023-11-26 DIAGNOSIS — R7303 Prediabetes: Secondary | ICD-10-CM | POA: Diagnosis not present

## 2023-11-26 DIAGNOSIS — E785 Hyperlipidemia, unspecified: Secondary | ICD-10-CM | POA: Diagnosis not present

## 2023-11-26 DIAGNOSIS — Z6825 Body mass index (BMI) 25.0-25.9, adult: Secondary | ICD-10-CM | POA: Diagnosis not present
